# Patient Record
Sex: Female | Born: 1995 | Race: Black or African American | Hispanic: No | Marital: Single | Smoking: Former smoker
Health system: Southern US, Community
[De-identification: ages and names within clinical notes are randomized; demographics above are authoritative.]

## PROBLEM LIST (undated history)

## (undated) DIAGNOSIS — F419 Anxiety disorder, unspecified: Secondary | ICD-10-CM

## (undated) DIAGNOSIS — Z789 Other specified health status: Secondary | ICD-10-CM

## (undated) DIAGNOSIS — D649 Anemia, unspecified: Secondary | ICD-10-CM

## (undated) DIAGNOSIS — R519 Headache, unspecified: Secondary | ICD-10-CM

## (undated) DIAGNOSIS — R87629 Unspecified abnormal cytological findings in specimens from vagina: Secondary | ICD-10-CM

## (undated) DIAGNOSIS — N200 Calculus of kidney: Secondary | ICD-10-CM

## (undated) DIAGNOSIS — F32A Depression, unspecified: Secondary | ICD-10-CM

## (undated) HISTORY — DX: Anemia, unspecified: D64.9

---

## 2016-09-22 DIAGNOSIS — O1490 Unspecified pre-eclampsia, unspecified trimester: Secondary | ICD-10-CM

## 2016-09-22 HISTORY — DX: Unspecified pre-eclampsia, unspecified trimester: O14.90

## 2020-07-01 ENCOUNTER — Other Ambulatory Visit: Payer: Self-pay

## 2020-07-01 ENCOUNTER — Ambulatory Visit (HOSPITAL_COMMUNITY)
Admission: EM | Admit: 2020-07-01 | Discharge: 2020-07-01 | Disposition: A | Discharge status: Home / Self Care | Payer: Medicaid Other | Attending: Family Medicine | Admitting: Family Medicine

## 2020-07-01 ENCOUNTER — Encounter (HOSPITAL_COMMUNITY): Payer: Self-pay | Admitting: *Deleted

## 2020-07-01 DIAGNOSIS — J020 Streptococcal pharyngitis: Secondary | ICD-10-CM | POA: Diagnosis present

## 2020-07-01 DIAGNOSIS — Z20822 Contact with and (suspected) exposure to covid-19: Secondary | ICD-10-CM | POA: Diagnosis not present

## 2020-07-01 LAB — SARS CORONAVIRUS 2 (TAT 6-24 HRS): SARS Coronavirus 2: NEGATIVE

## 2020-07-01 MED ORDER — AZITHROMYCIN 250 MG PO TABS: ORAL_TABLET | ORAL | 0 refills | Status: DC

## 2020-07-01 MED ORDER — LIDOCAINE VISCOUS HCL 2 % MT SOLN: 10.0000 mL | OROMUCOSAL | 0 refills | Status: DC | PRN

## 2020-07-01 NOTE — ED Triage Notes (Signed)
Pt reports a sore throat and Rt ear pain that started this AM. Pt also reports chills after getting off work today. Pt has also been taking care of her 2 sick family members.

## 2020-07-04 NOTE — ED Provider Notes (Signed)
[facility]    CSN: [id number] Arrival date & time: 07/01/20  1712      History   Chief Complaint Chief Complaint  Patient presents with  . Otalgia    RT  . Sore Throat    HPI Sarah Copeland is a 24 y.o. female.   Here today with new onset severe sore throat radiating up to right ear since this morning. Has also had fever and fatigue. Denies cough, congestion, CP, SOB, HAs, N/V/D. Taking OTC pain relievers with minimal relief. Several sick contacts in the home right now.       History reviewed. No pertinent past medical history.  There are no problems to display for this patient.   History reviewed. No pertinent surgical history.  OB History   No obstetric history on file.      Home Medications    Prior to Admission medications   Medication Sig Start Date End Date Taking? Authorizing Provider  azithromycin (ZITHROMAX) 250 MG tablet Take 2 tabs day one, then 1 tab daily until complete 07/01/20   Particia Nearing, PA-C  lidocaine (XYLOCAINE) 2 % solution Use as directed 10 mLs in the mouth or throat as needed for mouth pain. 07/01/20   Particia Nearing, PA-C    Family History History reviewed. No pertinent family history.  Social History Social History   Tobacco Use  . Smoking status: Never Smoker  . Smokeless tobacco: Never Used  Substance Use Topics  . Alcohol use: Never  . Drug use: Never     Allergies   Amoxicillin   Review of Systems Review of Systems PER HPI    Physical Exam Triage Vital Signs ED Triage Vitals  Enc Vitals Group     BP 07/01/20 1754 131/83     Pulse Rate 07/01/20 1754 98     Resp 07/01/20 1754 18     Temp 07/01/20 1754 100 F (37.8 C)     Temp Source 07/01/20 1754 Oral     SpO2 07/01/20 1754 99 %     Weight 07/01/20 1757 203 lb (92.1 kg)     Height 07/01/20 1757 5\' 7"  (1.702 m)     Head Circumference --      Peak Flow --      Pain Score 07/01/20 1756 10     Pain Loc --      Pain Edu?  --      Excl. in GC? --    No data found.  Updated Vital Signs BP 131/83 (BP Location: Left Arm)   Pulse 98   Temp 100 F (37.8 C) (Oral)   Resp 18   Ht 5\' 7"  (1.702 m)   Wt 203 lb (92.1 kg)   LMP 07/01/2020   SpO2 99%   BMI 31.79 kg/m   Visual Acuity Right Eye Distance:   Left Eye Distance:   Bilateral Distance:    Right Eye Near:   Left Eye Near:    Bilateral Near:     Physical Exam Vitals and nursing note reviewed.  Constitutional:      Appearance: Normal appearance. She is not ill-appearing.  HENT:     Head: Atraumatic.     Right Ear: Tympanic membrane normal.     Left Ear: Tympanic membrane normal.     Nose: Nose normal.     Mouth/Throat:     Mouth: Mucous membranes are moist.     Pharynx: Posterior oropharyngeal erythema (significant tonsillar edema, erythema and copious exudates)  present.  Eyes:     Extraocular Movements: Extraocular movements intact.     Conjunctiva/sclera: Conjunctivae normal.  Cardiovascular:     Rate and Rhythm: Normal rate and regular rhythm.     Heart sounds: Normal heart sounds.  Pulmonary:     Effort: Pulmonary effort is normal.     Breath sounds: Normal breath sounds. No wheezing or rales.  Abdominal:     General: Bowel sounds are normal. There is no distension.     Palpations: Abdomen is soft.     Tenderness: There is no abdominal tenderness. There is no guarding.  Musculoskeletal:        General: Normal range of motion.     Cervical back: Normal range of motion and neck supple.  Lymphadenopathy:     Cervical: Cervical adenopathy (b/l) present.  Skin:    General: Skin is warm and dry.  Neurological:     Mental Status: She is alert and oriented to person, place, and time.  Psychiatric:        Mood and Affect: Mood normal.        Thought Content: Thought content normal.        Judgment: Judgment normal.      UC Treatments / Results  Labs (all labs ordered are listed, but only abnormal results are displayed) Labs  Reviewed  SARS CORONAVIRUS 2 (TAT 6-24 HRS)    EKG   Radiology No results found.  Procedures Procedures (including critical care time)  Medications Ordered in UC Medications - No data to display  Initial Impression / Assessment and Plan / UC Course  I have reviewed the triage vital signs and the nursing notes.  Pertinent labs & imaging results that were available during my care of the patient were reviewed by me and considered in my medical decision making (see chart for details).     COVID test pending, strong suspicion for bacterial pharyngitis due to exam, fever and history. Will tx with zpak, viscous lidocaine, salt water gargles, and OTC pain relievers. Return if sxs worsening or failing to improve.   Final Clinical Impressions(s) / UC Diagnoses   Final diagnoses:  Pharyngitis due to Streptococcus species   Discharge Instructions   None    ED Prescriptions    Medication Sig Dispense Auth. Provider   azithromycin (ZITHROMAX) 250 MG tablet Take 2 tabs day one, then 1 tab daily until complete 6 tablet Particia Nearing, PA-C   lidocaine (XYLOCAINE) 2 % solution Use as directed 10 mLs in the mouth or throat as needed for mouth pain. 100 mL Particia Nearing, [place]     PDMP not reviewed this encounter.   Particia Nearing, [place] 07/04/20 2043

## 2020-09-22 ENCOUNTER — Encounter (HOSPITAL_COMMUNITY): Payer: Self-pay

## 2020-09-22 ENCOUNTER — Emergency Department (HOSPITAL_COMMUNITY)
Admission: EM | Admit: 2020-09-22 | Discharge: 2020-09-23 | Disposition: A | Discharge status: Home / Self Care | Payer: Medicaid Other | Attending: Emergency Medicine | Admitting: Emergency Medicine

## 2020-09-22 DIAGNOSIS — Z20822 Contact with and (suspected) exposure to covid-19: Secondary | ICD-10-CM | POA: Diagnosis not present

## 2020-09-22 DIAGNOSIS — R070 Pain in throat: Secondary | ICD-10-CM | POA: Diagnosis not present

## 2020-09-22 DIAGNOSIS — R519 Headache, unspecified: Secondary | ICD-10-CM | POA: Insufficient documentation

## 2020-09-22 DIAGNOSIS — Z5321 Procedure and treatment not carried out due to patient leaving prior to being seen by health care provider: Secondary | ICD-10-CM | POA: Diagnosis not present

## 2020-09-22 LAB — RESP PANEL BY RT-PCR (FLU A&B, COVID) ARPGX2
Influenza A by PCR: NEGATIVE
Influenza B by PCR: NEGATIVE
SARS Coronavirus 2 by RT PCR: NEGATIVE

## 2020-09-22 NOTE — ED Triage Notes (Signed)
Pt exposed to covid and wants testing, vaccinated, not boosted. Headache, sore throat

## 2020-09-23 NOTE — ED Notes (Signed)
Called for roo x2 with no response and not visible in lobby

## 2020-10-18 ENCOUNTER — Ambulatory Visit (HOSPITAL_COMMUNITY)
Admission: EM | Admit: 2020-10-18 | Discharge: 2020-10-18 | Disposition: A | Discharge status: Home / Self Care | Payer: Medicaid Other | Attending: Family Medicine | Admitting: Family Medicine

## 2020-10-18 ENCOUNTER — Encounter (HOSPITAL_COMMUNITY): Payer: Self-pay

## 2020-10-18 ENCOUNTER — Other Ambulatory Visit: Payer: Self-pay

## 2020-10-18 DIAGNOSIS — Z20822 Contact with and (suspected) exposure to covid-19: Secondary | ICD-10-CM | POA: Insufficient documentation

## 2020-10-18 DIAGNOSIS — N390 Urinary tract infection, site not specified: Secondary | ICD-10-CM | POA: Diagnosis present

## 2020-10-18 DIAGNOSIS — J069 Acute upper respiratory infection, unspecified: Secondary | ICD-10-CM | POA: Diagnosis present

## 2020-10-18 LAB — POCT URINALYSIS DIPSTICK, ED / UC
Bilirubin Urine: NEGATIVE
Glucose, UA: NEGATIVE mg/dL
Hgb urine dipstick: NEGATIVE
Nitrite: POSITIVE — AB
Protein, ur: NEGATIVE mg/dL
Specific Gravity, Urine: 1.025 (ref 1.005–1.030)
Urobilinogen, UA: 1 mg/dL (ref 0.0–1.0)
pH: 6 (ref 5.0–8.0)

## 2020-10-18 MED ORDER — NITROFURANTOIN MONOHYD MACRO 100 MG PO CAPS: 100.0000 mg | ORAL_CAPSULE | Freq: Two times a day (BID) | ORAL | 0 refills | Status: DC

## 2020-10-18 NOTE — ED Triage Notes (Signed)
Pt in with c/o body aches that started last night, also c/o fatigue and ST and right ear pain.  Also c/o headache that has been going on for over 1 week  Pt has been taking tylenol with no relief

## 2020-10-18 NOTE — ED Provider Notes (Signed)
[facility]    CSN: [id number] Arrival date & time: 10/18/20  1743      History   Chief Complaint Chief Complaint  Patient presents with  . Generalized Body Aches  . Sore Throat  . Headache    HPI Sarah Copeland is a 25 y.o. female.   Several day history of sore throat, headache, congestion, mild ear pressure and now 1-2 days of nocturia. Denies fever, chills, flank pain, abdominal pain, N/V/D, CP, SOB, dysuria, hematuria, vaginal sxs. Not taking anything OTC for sxs. No known sick contacts. No known chronic medical problems.      History reviewed. No pertinent past medical history.  There are no problems to display for this patient.   History reviewed. No pertinent surgical history.  OB History   No obstetric history on file.      Home Medications    Prior to Admission medications   Medication Sig Start Date End Date Taking? Authorizing Provider  nitrofurantoin, macrocrystal-monohydrate, (MACROBID) 100 MG capsule Take 1 capsule (100 mg total) by mouth 2 (two) times daily. 10/18/20  Yes Particia Nearing, PA-C  azithromycin [facility]) 250 MG tablet Take 2 tabs day one, then 1 tab daily until complete 07/01/20   Particia Nearing, PA-C  lidocaine (XYLOCAINE) 2 % solution Use as directed 10 mLs in the mouth or throat as needed for mouth pain. 07/01/20   Particia Nearing, PA-C    Family History History reviewed. No pertinent family history.  Social History Social History   Tobacco Use  . Smoking status: Never Smoker  . Smokeless tobacco: Never Used  Substance Use Topics  . Alcohol use: Never  . Drug use: Never     Allergies   Amoxicillin   Review of Systems Review of Systems PER HPI    Physical Exam Triage Vital Signs ED Triage Vitals  Enc Vitals Group     BP 10/18/20 1757 133/76     Pulse Rate 10/18/20 1757 83     Resp 10/18/20 1757 19     Temp 10/18/20 1757 98.9 F (37.2 C)     Temp Source 10/18/20 1757 Oral      SpO2 10/18/20 1757 100 %     Weight --      Height --      Head Circumference --      Peak Flow --      Pain Score 10/18/20 1753 8     Pain Loc --      Pain Edu? --      Excl. in GC? --    No data found.  Updated Vital Signs BP 133/76 (BP Location: Right Arm)   Pulse 83   Temp 98.9 F (37.2 C) (Oral)   Resp 19   LMP 09/24/2020 (Approximate)   SpO2 100%   Visual Acuity Right Eye Distance:   Left Eye Distance:   Bilateral Distance:    Right Eye Near:   Left Eye Near:    Bilateral Near:     Physical Exam Vitals and nursing note reviewed.  Constitutional:      Appearance: Normal appearance. She is not ill-appearing.  HENT:     Head: Atraumatic.     Right Ear: Tympanic membrane normal.     Left Ear: Tympanic membrane normal.     Nose: Rhinorrhea present.     Mouth/Throat:     Mouth: Mucous membranes are moist.     Pharynx: Oropharynx is clear. Posterior oropharyngeal erythema present.  Eyes:     Extraocular Movements: Extraocular movements intact.     Conjunctiva/sclera: Conjunctivae normal.  Cardiovascular:     Rate and Rhythm: Normal rate and regular rhythm.     Heart sounds: Normal heart sounds.  Pulmonary:     Effort: Pulmonary effort is normal.     Breath sounds: Normal breath sounds.  Abdominal:     General: Bowel sounds are normal. There is no distension.     Palpations: Abdomen is soft.     Tenderness: There is no abdominal tenderness. There is no right CVA tenderness, left CVA tenderness or guarding.  Musculoskeletal:        General: Normal range of motion.     Cervical back: Normal range of motion and neck supple.  Skin:    General: Skin is warm and dry.  Neurological:     Mental Status: She is alert and oriented to person, place, and time.  Psychiatric:        Mood and Affect: Mood normal.        Thought Content: Thought content normal.        Judgment: Judgment normal.      UC Treatments / Results  Labs (all labs ordered are listed,  but only abnormal results are displayed) Labs Reviewed  POCT URINALYSIS DIPSTICK, ED / UC - Abnormal; Notable for the following components:      Result Value   Ketones, ur TRACE (*)    Nitrite POSITIVE (*)    Leukocytes,Ua SMALL (*)    All other components within normal limits  SARS CORONAVIRUS 2 (TAT 6-24 HRS)  URINE CULTURE    EKG   Radiology No results found.  Procedures Procedures (including critical care time)  Medications Ordered in UC Medications - No data to display  Initial Impression / Assessment and Plan / UC Course  I have reviewed the triage vital signs and the nursing notes.  Pertinent labs & imaging results that were available during my care of the patient were reviewed by me and considered in my medical decision making (see chart for details).     Treat UTI with macrobid, fluids and await urine culture. COVID pcr pending for URI sxs, discussed OTC remedies, supportive care, isolation. Return for acutely worsening sxs.   Final Clinical Impressions(s) / UC Diagnoses   Final diagnoses:  Acute lower UTI  Viral URI   Discharge Instructions   None    ED Prescriptions    Medication Sig Dispense Auth. Provider   nitrofurantoin, macrocrystal-monohydrate, (MACROBID) 100 MG capsule Take 1 capsule (100 mg total) by mouth 2 (two) times daily. 10 capsule Particia Nearing, [place]     PDMP not reviewed this encounter.   Particia Nearing, [place] 10/18/20 1911

## 2020-10-19 LAB — SARS CORONAVIRUS 2 (TAT 6-24 HRS): SARS Coronavirus 2: NEGATIVE

## 2020-10-21 LAB — URINE CULTURE: Culture: >=100000 — AB

## 2020-10-30 ENCOUNTER — Ambulatory Visit (HOSPITAL_COMMUNITY)
Admission: EM | Admit: 2020-10-30 | Discharge: 2020-10-30 | Disposition: A | Discharge status: Home / Self Care | Payer: Medicaid Other | Attending: Family Medicine | Admitting: Family Medicine

## 2020-10-30 ENCOUNTER — Other Ambulatory Visit: Payer: Self-pay

## 2020-10-30 ENCOUNTER — Ambulatory Visit (INDEPENDENT_AMBULATORY_CARE_PROVIDER_SITE_OTHER): Payer: Medicaid Other

## 2020-10-30 ENCOUNTER — Ambulatory Visit (HOSPITAL_COMMUNITY): Payer: Medicaid Other

## 2020-10-30 ENCOUNTER — Encounter (HOSPITAL_COMMUNITY): Payer: Self-pay | Admitting: Emergency Medicine

## 2020-10-30 DIAGNOSIS — S93491A Sprain of other ligament of right ankle, initial encounter: Secondary | ICD-10-CM

## 2020-10-30 DIAGNOSIS — M79671 Pain in right foot: Secondary | ICD-10-CM

## 2020-10-30 DIAGNOSIS — M25571 Pain in right ankle and joints of right foot: Secondary | ICD-10-CM

## 2020-10-30 DIAGNOSIS — R8290 Unspecified abnormal findings in urine: Secondary | ICD-10-CM

## 2020-10-30 LAB — POCT URINALYSIS DIPSTICK, ED / UC
Bilirubin Urine: NEGATIVE
Glucose, UA: NEGATIVE mg/dL
Hgb urine dipstick: NEGATIVE
Ketones, ur: NEGATIVE mg/dL
Leukocytes,Ua: NEGATIVE
Nitrite: NEGATIVE
Protein, ur: NEGATIVE mg/dL
Specific Gravity, Urine: 1.015 (ref 1.005–1.030)
Urobilinogen, UA: 0.2 mg/dL (ref 0.0–1.0)
pH: 5.5 (ref 5.0–8.0)

## 2020-10-30 NOTE — Discharge Instructions (Addendum)
If not allergic, you may use over the counter ibuprofen or acetaminophen as needed. ° °

## 2020-10-30 NOTE — ED Triage Notes (Signed)
Pt presents with right foot pain after falling down steps earlier today. States felt like she rolled ankle. Pain is worst on top of foot.   Pt also c/o urinary frequency and urine odor. States completed antibiotics that were prescribed on 1/27. States doesn't feel worse, but does not feel better.

## 2020-10-31 NOTE — ED Provider Notes (Signed)
[facility]   [id number] 10/30/20 Arrival Time: 1835  ASSESSMENT & PLAN:  1. Pain in joint involving right ankle and foot   2. Sprain of anterior talofibular ligament of right ankle, initial encounter   3. Cloudy urine     I have personally viewed the imaging studies ordered this visit. No fractures appreciated.  ASO. WBAT. Has ibuprofen to take. U/A normal; reassured.  Recommend:  Follow-up Information    [facility].   Why: If worsening or failing to improve as anticipated. Contact information: [address] [place] [address] [phone]       Schedule an appointment as soon as possible for a visit  with [facility].   Contact information: [address] [place] [address] [phone]              Reviewed expectations re: course of current medical issues. Questions answered. Outlined signs and symptoms indicating need for more acute intervention. Patient verbalized understanding. After Visit Summary given.  SUBJECTIVE: History from: patient. Sarah Copeland is a 25 y.o. female who reports fairly persistent moderate pain of her R ankle/foot; described as aching; without radiation. Onset: abrupt. First noted: today. Injury/trama: reports falling/twisting ankle/foot on stairs today. Symptoms have progressed to a point and plateaued since beginning. Aggravating factors: certain movements and weight bearing. Alleviating factors: rest. Associated symptoms: none reported. Extremity sensation changes or weakness: none. Self treatment: has not tried OTC therapies.  History of similar: no.  History reviewed. No pertinent surgical history.   Also recently completed antibiotic for UTI. Still with "cloudy urine". Afebrile. No dysuria or vag d/c.   OBJECTIVE:  Vitals:   10/30/20 1926  BP: 127/77  Pulse: 89  Resp: 18  Temp: 98.9 F (37.2 C)  TempSrc: Oral   SpO2: 100%    General appearance: alert; no distress HEENT: [place]; AT Neck: supple with FROM Resp: unlabored respirations Extremities: . RLE: warm with well perfused appearance; fairly well localized moderate tenderness over right lateral ankle at ATFL; without gross deformities; swelling: moderate; bruising: none; ankle ROM: normal, with discomfort CV: brisk extremity capillary refill of RLE; 2+ DP pulse of RLE. Skin: warm and dry; no visible rashes Neurologic: gait normal but favors R ankle; normal sensation and strength of RLE Psychological: alert and cooperative; normal mood and affect  Imaging: DG Ankle Complete Right  Result Date: 10/30/2020 CLINICAL DATA:  Foot and ankle pain after fall EXAM: RIGHT ANKLE - COMPLETE 3+ VIEW COMPARISON:  Foot series today FINDINGS: There is no evidence of fracture, dislocation, or joint effusion. There is no evidence of arthropathy or other focal bone abnormality. Soft tissues are unremarkable. IMPRESSION: Negative. Electronically Signed   By: Charlett Nose M.D.   On: 10/30/2020 20:12   DG Foot Complete Right  Result Date: 10/30/2020 CLINICAL DATA:  Trauma EXAM: RIGHT FOOT COMPLETE - 3+ VIEW COMPARISON:  None. FINDINGS: There is no evidence of fracture or dislocation. There is no evidence of arthropathy or other focal bone abnormality. Soft tissues are unremarkable. IMPRESSION: Negative. Electronically Signed   By: Charlett Nose M.D.   On: 10/30/2020 20:11      Allergies  Allergen Reactions  . Amoxicillin Anaphylaxis    History reviewed. No pertinent past medical history.    Social History   Socioeconomic History  . Marital status: Single    Spouse name: Not on file  . Number of children: Not on file  .  Years of education: Not on file  . Highest education level: Not on file  Occupational History  . Not on file  Tobacco Use  . Smoking status: Never Smoker  . Smokeless tobacco: Never Used  Substance and Sexual Activity  . Alcohol use:  Never  . Drug use: Never  . Sexual activity: Not Currently  Other Topics Concern  . Not on file  Social History Narrative  . Not on file   Social Determinants of Health   Financial Resource Strain: Not on file  Food Insecurity: Not on file  Transportation Needs: Not on file  Physical Activity: Not on file  Stress: Not on file  Social Connections: Not on file   History reviewed. No pertinent family history. History reviewed. No pertinent surgical history.    Mardella Layman, MD 10/31/20 [phone]

## 2020-11-22 ENCOUNTER — Encounter (HOSPITAL_COMMUNITY): Payer: Self-pay | Admitting: Emergency Medicine

## 2020-11-22 ENCOUNTER — Other Ambulatory Visit: Payer: Self-pay

## 2020-11-22 ENCOUNTER — Ambulatory Visit (HOSPITAL_COMMUNITY)
Admission: EM | Admit: 2020-11-22 | Discharge: 2020-11-22 | Disposition: A | Discharge status: Home / Self Care | Payer: Medicaid Other | Attending: Family Medicine | Admitting: Family Medicine

## 2020-11-22 DIAGNOSIS — N3000 Acute cystitis without hematuria: Secondary | ICD-10-CM

## 2020-11-22 LAB — POCT URINALYSIS DIPSTICK, ED / UC
Bilirubin Urine: NEGATIVE
Glucose, UA: NEGATIVE mg/dL
Hgb urine dipstick: NEGATIVE
Nitrite: POSITIVE — AB
Protein, ur: NEGATIVE mg/dL
Specific Gravity, Urine: >=1.03 (ref 1.005–1.030)
Urobilinogen, UA: 0.2 mg/dL (ref 0.0–1.0)
pH: 5.5 (ref 5.0–8.0)

## 2020-11-22 LAB — POC URINE PREG, ED: Preg Test, Ur: POSITIVE — AB

## 2020-11-22 MED ORDER — NITROFURANTOIN MONOHYD MACRO 100 MG PO CAPS: 100.0000 mg | ORAL_CAPSULE | Freq: Two times a day (BID) | ORAL | 0 refills | Status: DC

## 2020-11-22 MED ORDER — MICONAZOLE NITRATE 2 % EX CREA: 1.0000 "application " | TOPICAL_CREAM | Freq: Two times a day (BID) | CUTANEOUS | 0 refills | Status: DC

## 2020-11-22 NOTE — Discharge Instructions (Addendum)
Take antibiotic once in the morning and once in the evening for 5 days  Can use cream on the outside twice a day for up to 14 days  Can use lubrication for additional comfort   Follow up for persistent symptoms, blood in urine, abdominal pain, back pain, urgency, discharge, increase odor, new lesions

## 2020-11-22 NOTE — ED Triage Notes (Signed)
Pt states that she has vaginal discharge and dysuria that started Saturday. Pt states that she took a pregnancy test Saturday tested positive.

## 2020-11-22 NOTE — ED Provider Notes (Signed)
[facility]    CSN: [id number] Arrival date & time: 11/22/20  1033      History   Chief Complaint Chief Complaint  Patient presents with  . Vaginal Discharge  . Dysuria    HPI Sarah Copeland is a 25 y.o. female.   Patient presents with vaginal irritation, vaginal dryness, Sarah Copeland thick discharge and urinary frequency starting one day ago worsening this morning. Denies itching, odor, urgency, abdominal pain, flank pain, fever, chills. Denies known STI contact. Pregnancy test on 3/2, positive.   History reviewed. No pertinent past medical history.  There are no problems to display for this patient.   History reviewed. No pertinent surgical history.  OB History   No obstetric history on file.      Home Medications    Prior to Admission medications   Medication Sig Start Date End Date Taking? Authorizing Provider  miconazole (MICOTIN) 2 % cream Apply 1 application topically 2 (two) times daily. 11/22/20  Yes Mari Battaglia, Adrienne R, NP  nitrofurantoin, macrocrystal-monohydrate, (MACROBID) 100 MG capsule Take 1 capsule (100 mg total) by mouth 2 (two) times daily. 11/22/20  Yes Noell Lorensen, Adrienne R, NP  lidocaine (XYLOCAINE) 2 % solution Use as directed 10 mLs in the mouth or throat as needed for mouth pain. 07/01/20   Particia Nearing, PA-C    Family History History reviewed. No pertinent family history.  Social History Social History   Tobacco Use  . Smoking status: Never Smoker  . Smokeless tobacco: Never Used  Substance Use Topics  . Alcohol use: Never  . Drug use: Never     Allergies   Amoxicillin   Review of Systems Review of Systems  Constitutional: Negative.   Respiratory: Negative.   Cardiovascular: Negative.   Gastrointestinal: Negative.   Genitourinary: Positive for frequency and vaginal discharge. Negative for decreased urine volume, difficulty urinating, dyspareunia, dysuria, enuresis, flank pain, genital sores, hematuria, menstrual  problem, pelvic pain, urgency, vaginal bleeding and vaginal pain.  Musculoskeletal: Negative.   Skin: Negative.      Physical Exam Triage Vital Signs ED Triage Vitals  Enc Vitals Group     BP 11/22/20 1103 130/74     Pulse Rate 11/22/20 1103 (!) 104     Resp 11/22/20 1103 18     Temp --      Temp Source 11/22/20 1103 Oral     SpO2 11/22/20 1103 95 %     Weight --      Height --      Head Circumference --      Peak Flow --      Pain Score 11/22/20 1101 0     Pain Loc --      Pain Edu? --      Excl. in GC? --    No data found.  Updated Vital Signs BP 130/74 (BP Location: Right Arm)   Pulse (!) 104   Resp 18   LMP 10/20/2020   SpO2 95%   Visual Acuity Right Eye Distance:   Left Eye Distance:   Bilateral Distance:    Right Eye Near:   Left Eye Near:    Bilateral Near:     Physical Exam Constitutional:      Appearance: Normal appearance. She is normal weight.  HENT:     Head: Normocephalic.  Eyes:     Extraocular Movements: Extraocular movements intact.  Pulmonary:     Effort: Pulmonary effort is normal.  Abdominal:     General: Abdomen is  flat. Bowel sounds are normal. There is no distension.     Palpations: Abdomen is soft. There is no mass.     Tenderness: There is no abdominal tenderness. There is no right CVA tenderness, left CVA tenderness or guarding.  Musculoskeletal:     Cervical back: Normal range of motion and neck supple.  Skin:    General: Skin is warm and dry.  Neurological:     Mental Status: She is alert and oriented to person, place, and time. Mental status is at baseline.  Psychiatric:        Mood and Affect: Mood normal.        Behavior: Behavior normal.        Thought Content: Thought content normal.        Judgment: Judgment normal.      UC Treatments / Results  Labs (all labs ordered are listed, but only abnormal results are displayed) Labs Reviewed  POCT URINALYSIS DIPSTICK, ED / UC - Abnormal; Notable for the following  components:      Result Value   Ketones, ur TRACE (*)    Nitrite POSITIVE (*)    Leukocytes,Ua TRACE (*)    All other components within normal limits  POC URINE PREG, ED - Abnormal; Notable for the following components:   Preg Test, Ur POSITIVE (*)    All other components within normal limits    EKG   Radiology No results found.  Procedures Procedures (including critical care time)  Medications Ordered in UC Medications - No data to display  Initial Impression / Assessment and Plan / UC Course  I have reviewed the triage vital signs and the nursing notes.  Pertinent labs & imaging results that were available during my care of the patient were reviewed by me and considered in my medical decision making (see chart for details).  Clinical Course as of 11/22/20 1145  Thu Nov 22, 2020  1134 Leukocytes,Ua(!): TRACE [AW]    Clinical Course User Index [AW] Valinda Hoar, NP    Acute cystitis  1. Urinalysis- positive, Macrobid 100 mg bid for 5 days 2. Urine pregnancy- positive- given information to the [facility] to establish care 3. Miconazole 2% cream bid as needed for 14 days, symptomatic of a yeast infection  4. Follow up precautions given. Verbalized understanding  Final Clinical Impressions(s) / UC Diagnoses   Final diagnoses:  Acute cystitis without hematuria     Discharge Instructions     Take antibiotic once in the morning and once in the evening for 5 days  Can use cream on the outside twice a day for up to 14 days  Can use lubrication for additional comfort   Follow up for persistent symptoms, blood in urine, abdominal pain, back pain, urgency, discharge, increase odor, new lesions    ED Prescriptions    Medication Sig Dispense Auth. Provider   nitrofurantoin, macrocrystal-monohydrate, (MACROBID) 100 MG capsule Take 1 capsule (100 mg total) by mouth 2 (two) times daily. 10 capsule Dontario Evetts, Adrienne R, NP   miconazole (MICOTIN) 2 % cream Apply 1  application topically 2 (two) times daily. 28.35 g Valinda Hoar, NP     PDMP not reviewed this encounter.   Valinda Hoar, NP 11/22/20 1334

## 2020-11-29 ENCOUNTER — Other Ambulatory Visit: Payer: Self-pay

## 2020-11-29 ENCOUNTER — Inpatient Hospital Stay (HOSPITAL_COMMUNITY)
Admission: AD | Admit: 2020-11-29 | Discharge: 2020-11-30 | Disposition: A | Discharge status: Home / Self Care | Payer: Medicaid Other | Attending: Obstetrics and Gynecology | Admitting: Obstetrics and Gynecology

## 2020-11-29 ENCOUNTER — Inpatient Hospital Stay (HOSPITAL_COMMUNITY): Payer: Medicaid Other

## 2020-11-29 ENCOUNTER — Encounter (HOSPITAL_COMMUNITY): Payer: Self-pay

## 2020-11-29 DIAGNOSIS — O99891 Other specified diseases and conditions complicating pregnancy: Secondary | ICD-10-CM | POA: Diagnosis not present

## 2020-11-29 DIAGNOSIS — Z880 Allergy status to penicillin: Secondary | ICD-10-CM | POA: Insufficient documentation

## 2020-11-29 DIAGNOSIS — B9689 Other specified bacterial agents as the cause of diseases classified elsewhere: Secondary | ICD-10-CM | POA: Diagnosis not present

## 2020-11-29 DIAGNOSIS — O23591 Infection of other part of genital tract in pregnancy, first trimester: Secondary | ICD-10-CM | POA: Insufficient documentation

## 2020-11-29 DIAGNOSIS — O26851 Spotting complicating pregnancy, first trimester: Secondary | ICD-10-CM

## 2020-11-29 DIAGNOSIS — Z3A01 Less than 8 weeks gestation of pregnancy: Secondary | ICD-10-CM | POA: Diagnosis not present

## 2020-11-29 DIAGNOSIS — O209 Hemorrhage in early pregnancy, unspecified: Secondary | ICD-10-CM | POA: Diagnosis present

## 2020-11-29 DIAGNOSIS — N760 Acute vaginitis: Secondary | ICD-10-CM | POA: Diagnosis not present

## 2020-11-29 DIAGNOSIS — Z3490 Encounter for supervision of normal pregnancy, unspecified, unspecified trimester: Secondary | ICD-10-CM

## 2020-11-29 HISTORY — DX: Other specified health status: Z78.9

## 2020-11-29 NOTE — ED Triage Notes (Signed)
Reports positive for pregnancy last week and today having abdominal cramping and light vaginal bleeding.

## 2020-11-29 NOTE — MAU Note (Signed)
Pt reports some cramping, some lower back pain, some nausea. Had "one drop of blood" on her panties this afternoon.

## 2020-11-29 NOTE — ED Provider Notes (Signed)
Emergency Medicine Provider OB Triage Evaluation Note  Sarah Copeland is a 25 y.o. female, G3P2, at Unknown gestation who presents to the emergency department with complaints of abdominal cramping and vaginal spotting that began today.  Reports she is received some early OB care with the health department however has not had a formal ultrasound.  Is awaiting appointment with [facility] health [facility].  Review of  Systems  Positive: Abdominal pain Negative: Fever  Physical Exam  BP 126/80 (BP Location: Left Arm)   Pulse (!) 104   Temp 99 F (37.2 C) (Oral)   Resp 16   Ht 5\' 7"  (1.702 m)   Wt 92 kg   LMP  (LMP Unknown)   SpO2 99%   BMI 31.77 kg/m  General: Awake, no distress  HEENT: Atraumatic  Resp: Normal effort  Cardiac: Normal rate Abd: Nondistended, nontender  MSK: Moves all extremities without difficulty Neuro: Speech clear  Medical Decision Making  Pt evaluated for pregnancy concern and is stable for transfer to MAU. Pt is in agreement with plan for transfer.  9:57 PM Discussed with MAU APP, Sam, who accepts patient in transfer.  Clinical Impression  No diagnosis found.     Nicholis Stepanek, N, PA-C 11/30/20 0011    01/30/21, MD 12/03/20 [phone]

## 2020-11-29 NOTE — ED Notes (Signed)
Patient taken via wheelchair by tech to MAU

## 2020-11-30 DIAGNOSIS — B9689 Other specified bacterial agents as the cause of diseases classified elsewhere: Secondary | ICD-10-CM | POA: Diagnosis not present

## 2020-11-30 DIAGNOSIS — O26851 Spotting complicating pregnancy, first trimester: Secondary | ICD-10-CM | POA: Diagnosis not present

## 2020-11-30 DIAGNOSIS — N760 Acute vaginitis: Secondary | ICD-10-CM

## 2020-11-30 DIAGNOSIS — Z3A01 Less than 8 weeks gestation of pregnancy: Secondary | ICD-10-CM

## 2020-11-30 DIAGNOSIS — O99891 Other specified diseases and conditions complicating pregnancy: Secondary | ICD-10-CM

## 2020-11-30 LAB — CBC
HCT: 31.8 % — ABNORMAL LOW (ref 36.0–46.0)
Hemoglobin: 10.4 g/dL — ABNORMAL LOW (ref 12.0–15.0)
MCH: 21.6 pg — ABNORMAL LOW (ref 26.0–34.0)
MCHC: 32.7 g/dL (ref 30.0–36.0)
MCV: 66 fL — ABNORMAL LOW (ref 80.0–100.0)
Platelets: 304 10*3/uL (ref 150–400)
RBC: 4.82 MIL/uL (ref 3.87–5.11)
RDW: 15.2 % (ref 11.5–15.5)
WBC: 9.7 10*3/uL (ref 4.0–10.5)
nRBC: 0 % (ref 0.0–0.2)

## 2020-11-30 LAB — COMPREHENSIVE METABOLIC PANEL
ALT: 11 U/L (ref 0–44)
AST: 14 U/L — ABNORMAL LOW (ref 15–41)
Albumin: 3.5 g/dL (ref 3.5–5.0)
Alkaline Phosphatase: 75 U/L (ref 38–126)
Anion gap: 5 (ref 5–15)
BUN: 9 mg/dL (ref 6–20)
CO2: 26 mmol/L (ref 22–32)
Calcium: 9.3 mg/dL (ref 8.9–10.3)
Chloride: 103 mmol/L (ref 98–111)
Creatinine, Ser: 0.69 mg/dL (ref 0.44–1.00)
GFR, Estimated: >60 mL/min (ref >60)
Glucose, Bld: 84 mg/dL (ref 70–99)
Potassium: 3.6 mmol/L (ref 3.5–5.1)
Sodium: 134 mmol/L — ABNORMAL LOW (ref 135–145)
Total Bilirubin: 0.2 mg/dL — ABNORMAL LOW (ref 0.3–1.2)
Total Protein: 7.6 g/dL (ref 6.5–8.1)

## 2020-11-30 LAB — GC/CHLAMYDIA PROBE AMP (~~LOC~~) NOT AT ARMC
Chlamydia: NEGATIVE
Comment: NEGATIVE
Comment: NORMAL
Neisseria Gonorrhea: NEGATIVE

## 2020-11-30 LAB — WET PREP, GENITAL
Sperm: NONE SEEN
Trich, Wet Prep: NONE SEEN
Yeast Wet Prep HPF POC: NONE SEEN

## 2020-11-30 LAB — HCG, QUANTITATIVE, PREGNANCY: hCG, Beta Chain, Quant, S: 18783 m[IU]/mL — ABNORMAL HIGH (ref <5)

## 2020-11-30 LAB — ABO/RH: ABO/RH(D): O POS

## 2020-11-30 MED ORDER — METRONIDAZOLE 500 MG PO TABS: 500.0000 mg | ORAL_TABLET | Freq: Two times a day (BID) | ORAL | 0 refills | Status: DC

## 2020-11-30 NOTE — MAU Provider Note (Signed)
History     CSN: [id number]  Arrival date and time: 11/29/20 2139   Event Date/Time   First Provider Initiated Contact with Patient 11/30/20 0024      Chief Complaint  Patient presents with  . Vaginal Bleeding  . Routine Prenatal Visit  . Abdominal Pain   HPI Sarah Copeland is a 25 y.o. G3P2002 at [redacted]w[redacted]d who presents to MAU with chief complaint of vaginal spotting. This is a new problem, onset today 11/29/2020. Patient endorses seeing smears of blood when she wipes after voiding. She denies heavy vaginal bleeding, dysuria, abdominal tenderness, fever.  Patient also c/o lower abdominal cramping. This is a new problem, onset coinciding with onset of cramping. Her pain score is 5/10. Her pain does not radiate. She denies aggravating or alleviating factors. She has not taken medication or tried other treatments for this complaint.   Patient is s/p diagnosis of UTI following a visit to Urgent Care on 11/21/2020. She was prescribed Macrobid and states she has one pill left.   Patient has not selected a prenatal care team for this pregnancy and requests a list of options.  OB History    Gravida  3   Para  2   Term  2   Preterm      AB      Living  2     SAB      IAB      Ectopic      Multiple      Live Births  2           Past Medical History:  Diagnosis Date  . Medical history non-contributory     No past surgical history on file.  No family history on file.  Social History   Tobacco Use  . Smoking status: Never Smoker  . Smokeless tobacco: Never Used  Substance Use Topics  . Alcohol use: Never  . Drug use: Never    Allergies:  Allergies  Allergen Reactions  . Amoxicillin Anaphylaxis    Medications Prior to Admission  Medication Sig Dispense Refill Last Dose  . lidocaine (XYLOCAINE) 2 % solution Use as directed 10 mLs in the mouth or throat as needed for mouth pain. 100 mL 0   . miconazole (MICOTIN) 2 % cream Apply 1 application topically 2  (two) times daily. 28.35 g 0   . nitrofurantoin, macrocrystal-monohydrate, (MACROBID) 100 MG capsule Take 1 capsule (100 mg total) by mouth 2 (two) times daily. 10 capsule 0     Review of Systems  Gastrointestinal: Positive for abdominal pain.  Genitourinary: Positive for vaginal bleeding.  All other systems reviewed and are negative.  Physical Exam   Blood pressure 139/69, pulse 92, temperature 98.9 F (37.2 C), temperature source Oral, resp. rate 19, height 5\' 7"  (1.702 m), weight 92 kg, last menstrual period 10/20/2020, SpO2 100 %.  Physical Exam Vitals and nursing note reviewed. Exam conducted with a chaperone present.  Constitutional:      Appearance: She is well-developed. She is not ill-appearing.  Cardiovascular:     Rate and Rhythm: Normal rate.     Heart sounds: Normal heart sounds.  Pulmonary:     Effort: Pulmonary effort is normal.     Breath sounds: Normal breath sounds.  Abdominal:     General: Bowel sounds are normal.     Palpations: Abdomen is soft.     Tenderness: There is no abdominal tenderness.  Genitourinary:    Comments: Deferred Skin:  Capillary Refill: Capillary refill takes less than 2 seconds.  Neurological:     Mental Status: She is alert and oriented to person, place, and time.     MAU Course  Procedures  Orders Placed This Encounter  Procedures  . Wet prep, genital  . US OB LESS THAN 14 WEEKS WITH OB TRANSVAGINAL  . US OB Transvaginal  . CBC  . Comprehensive metabolic panel  . hCG, quantitative, pregnancy  . Nursing communication  . ABO/Rh  . Discharge patient   Patient Vitals for the past 24 hrs:  BP Temp Temp src Pulse Resp SpO2 Height Weight  11/30/20 0034 139/69 -- -- 92 -- -- -- --  11/29/20 2351 118/86 98.9 F (37.2 C) Oral 85 19 100 % -- --  11/29/20 2145 -- 99 F (37.2 C) Oral -- -- -- 5\' 7"  (1.702 m) 92 kg  11/29/20 2144 126/80 -- -- (!) 104 16 99 % -- --   Results for orders placed or performed during the hospital  encounter of 11/29/20 (from the past 24 hour(s))  Wet prep, genital     Status: Abnormal   Collection Time: 11/29/20 11:14 PM  Result Value Ref Range   Yeast Wet Prep HPF POC NONE SEEN NONE SEEN   Trich, Wet Prep NONE SEEN NONE SEEN   Clue Cells Wet Prep HPF POC PRESENT (A) NONE SEEN   WBC, Wet Prep HPF POC MANY (A) NONE SEEN   Sperm NONE SEEN   CBC     Status: Abnormal   Collection Time: 11/29/20 11:29 PM  Result Value Ref Range   WBC 9.7 4.0 - 10.5 K/uL   RBC 4.82 3.87 - 5.11 MIL/uL   Hemoglobin 10.4 (L) 12.0 - 15.0 g/dL   HCT 01/29/21 (L) 40.9 - 81.1 %   MCV 66.0 (L) 80.0 - 100.0 fL   MCH 21.6 (L) 26.0 - 34.0 pg   MCHC 32.7 30.0 - 36.0 g/dL   RDW 91.4 78.2 - 95.6 %   Platelets 304 150 - 400 K/uL   nRBC 0.0 0.0 - 0.2 %  Comprehensive metabolic panel     Status: Abnormal   Collection Time: 11/29/20 11:29 PM  Result Value Ref Range   Sodium 134 (L) 135 - 145 mmol/L   Potassium 3.6 3.5 - 5.1 mmol/L   Chloride 103 98 - 111 mmol/L   CO2 26 22 - 32 mmol/L   Glucose, Bld 84 70 - 99 mg/dL   BUN 9 6 - 20 mg/dL   Creatinine, Ser 01/29/21 0.44 - 1.00 mg/dL   Calcium 9.3 8.9 - 0.86 mg/dL   Total Protein 7.6 6.5 - 8.1 g/dL   Albumin 3.5 3.5 - 5.0 g/dL   AST 14 (L) 15 - 41 U/L   ALT 11 0 - 44 U/L   Alkaline Phosphatase 75 38 - 126 U/L   Total Bilirubin 0.2 (L) 0.3 - 1.2 mg/dL   GFR, Estimated 57.8 >46 mL/min   Anion gap 5 5 - 15  hCG, quantitative, pregnancy     Status: Abnormal   Collection Time: 11/29/20 11:29 PM  Result Value Ref Range   hCG, Beta Chain, Quant, S 18,783 (H) <5 mIU/mL  ABO/Rh     Status: None   Collection Time: 11/29/20 11:29 PM  Result Value Ref Range   ABO/RH(D) O POS    No rh immune globuloin      NOT A RH IMMUNE GLOBULIN CANDIDATE, PT RH POSITIVE Performed at [facility],  [address], [place], [place] [address]    US OB LESS THAN 14 WEEKS WITH OB TRANSVAGINAL  Result Date: 11/30/2020 CLINICAL DATA:  Cramping and lower back pain with some vaginal  bleeding. EXAM: OBSTETRIC <14 WK [place] AND TRANSVAGINAL OB US TECHNIQUE: Both transabdominal and transvaginal ultrasound examinations were performed for complete evaluation of the gestation as well as the maternal uterus, adnexal regions, and pelvic cul-de-sac. Transvaginal technique was performed to assess early pregnancy. COMPARISON:  None. FINDINGS: Intrauterine gestational sac: Single Yolk sac:  Visualized. Embryo:  Not Visualized. Cardiac Activity: Not Visualized. Heart Rate: N/A  bpm MSD: 11.4 mm   5 w   6 d Subchorionic hemorrhage:  None visualized. Maternal uterus/adnexae: The bilateral ovaries are visualized and are normal in appearance. No pelvic free fluid is seen. IMPRESSION: Single intrauterine gestational sac and yolk sac, at approximately 5 weeks and 6 days gestation by ultrasound evaluation, without visualization of a fetal pole. While this is likely secondary to early intrauterine pregnancy, correlation with follow-up pelvic ultrasound is recommended. Electronically Signed   By: Aram Candela M.D.   On: 11/30/2020 00:11   Meds ordered this encounter  Medications  . metroNIDAZOLE (FLAGYL) 500 MG tablet    Sig: Take 1 tablet (500 mg total) by mouth 2 (two) times daily.    Dispense:  14 tablet    Refill:  0    Order Specific Question:   Supervising Provider    Answer:   Warden Fillers [[id number]]   Assessment and Plan  --25 y.o. [id number] with IUP --Bacterial Vaginosis --Blood type O POS --Tylenol PRN for abdominal cramping --Discharge home in stable condition with first trimester precautions  F/U: --Order placed for viability scan in 10-14 days  Calvert Cantor, CNM 11/30/2020, 1:52 AM

## 2020-11-30 NOTE — Discharge Instructions (Signed)
https://www.cdc.gov/pregnancy/infections.html">  First Trimester of Pregnancy  The first trimester of pregnancy starts on the first day of your last menstrual period until the end of week 12. This is also called months 1 through 3 of pregnancy. Body changes during your first trimester Your body goes through many changes during pregnancy. The changes usually return to normal after your baby is born. Physical changes  You may gain or lose weight.  Your breasts may grow larger and hurt. The area around your nipples may get darker.  Dark spots or blotches may develop on your face.  You may have changes in your hair. Health changes  You may feel like you might vomit (nauseous), and you may vomit.  You may have heartburn.  You may have headaches.  You may have trouble pooping (constipation).  Your gums may bleed. Other changes  You may get tired easily.  You may pee (urinate) more often.  Your menstrual periods will stop.  You may not feel hungry.  You may want to eat certain kinds of food.  You may have changes in your emotions from day to day.  You may have more dreams. Follow these instructions at home: Medicines  Take over-the-counter and prescription medicines only as told by your doctor. Some medicines are not safe during pregnancy.  Take a prenatal vitamin that contains at least 600 micrograms (mcg) of folic acid. Eating and drinking  Eat healthy meals that include: ? Fresh fruits and vegetables. ? Whole grains. ? Good sources of protein, such as meat, eggs, or tofu. ? Low-fat dairy products.  Avoid raw meat and unpasteurized juice, milk, and cheese.  If you feel like you may vomit, or you vomit: ? Eat 4 or 5 small meals a day instead of 3 large meals. ? Try eating a few soda crackers. ? Drink liquids between meals instead of during meals.  You may need to take these actions to prevent or treat trouble pooping: ? Drink enough fluids to keep your pee  (urine) pale yellow. ? Eat foods that are high in fiber. These include beans, whole grains, and fresh fruits and vegetables. ? Limit foods that are high in fat and sugar. These include fried or sweet foods. Activity  Exercise only as told by your doctor. Most people can do their usual exercise routine during pregnancy.  Stop exercising if you have cramps or pain in your lower belly (abdomen) or low back.  Do not exercise if it is too hot or too humid, or if you are in a place of great height (high altitude).  Avoid heavy lifting.  If you choose to, you may have sex unless your doctor tells you not to. Relieving pain and discomfort  Wear a good support bra if your breasts are sore.  Rest with your legs raised (elevated) if you have leg cramps or low back pain.  If you have bulging veins (varicose veins) in your legs: ? Wear support hose as told by your doctor. ? Raise your feet for 15 minutes, 3-4 times a day. ? Limit salt in your food. Safety  Wear your seat belt at all times when you are in a car.  Talk with your doctor if someone is hurting you or yelling at you.  Talk with your doctor if you are feeling sad or have thoughts of hurting yourself. Lifestyle  Do not use hot tubs, steam rooms, or saunas.  Do not douche. Do not use tampons or scented sanitary pads.  Do not   use herbal medicines, illegal drugs, or medicines that are not approved by your doctor. Do not drink alcohol.  Do not smoke or use any products that contain nicotine or tobacco. If you need help quitting, ask your doctor.  Avoid cat litter boxes and soil that is used by cats. These carry germs that can cause harm to the baby and can cause a loss of your baby by miscarriage or stillbirth. General instructions  Keep all follow-up visits. This is important.  Ask for help if you need counseling or if you need help with nutrition. Your doctor can give you advice or tell you where to go for help.  Visit your  dentist. At home, brush your teeth with a soft toothbrush. Floss gently.  Write down your questions. Take them to your prenatal visits. Where to find more information  American Pregnancy Association: americanpregnancy.org  [employer]: www.acog.org  Office on Women's Health: MightyReward.co.nz Contact a doctor if:  You are dizzy.  You have a fever.  You have mild cramps or pressure in your lower belly.  You have a nagging pain in your belly area.  You continue to feel like you may vomit, you vomit, or you have watery poop (diarrhea) for 24 hours or longer.  You have a bad-smelling fluid coming from your vagina.  You have pain when you pee.  You are exposed to a disease that spreads from person to person, such as chickenpox, measles, Zika virus, HIV, or hepatitis. Get help right away if:  You have spotting or bleeding from your vagina.  You have very bad belly cramping or pain.  You have shortness of breath or chest pain.  You have any kind of injury, such as from a fall or a car crash.  You have new or increased pain, swelling, or redness in an arm or leg. Summary  The first trimester of pregnancy starts on the first day of your last menstrual period until the end of week 12 (months 1 through 3).  Eat 4 or 5 small meals a day instead of 3 large meals.  Do not smoke or use any products that contain nicotine or tobacco. If you need help quitting, ask your doctor.  Keep all follow-up visits. This information is not intended to replace advice given to you by your health care provider. Make sure you discuss any questions you have with your health care provider. Document Revised: 02/15/2020 Document Reviewed: 12/22/2019 Elsevier Patient Education  2021 Elsevier Inc.  Safe Medications in Pregnancy   Acne: Benzoyl Peroxide Salicylic Acid  Backache/Headache: Tylenol: 2 regular strength every 4 hours OR              2  Extra strength every 6 hours  Colds/Coughs/Allergies: Benadryl (alcohol free) 25 mg every 6 hours as needed Breath right strips Claritin Cepacol throat lozenges Chloraseptic throat spray Cold-Eeze- up to three times per day Cough drops, alcohol free Flonase (by prescription only) Guaifenesin Mucinex Robitussin DM (plain only, alcohol free) Saline nasal spray/drops Sudafed (pseudoephedrine) & Actifed ** use only after [redacted] weeks gestation and if you do not have high blood pressure Tylenol Vicks Vaporub Zinc lozenges Zyrtec   Constipation: Colace Ducolax suppositories Fleet enema Glycerin suppositories Metamucil Milk of magnesia Miralax Senokot Smooth move tea  Diarrhea: Kaopectate Imodium A-D  *NO pepto Bismol  Hemorrhoids: Anusol Anusol HC Preparation H Tucks  Indigestion: Tums Maalox Mylanta Zantac  Pepcid  Insomnia: Benadryl (alcohol free) 25mg  every 6 hours as  needed Tylenol PM Unisom, no Gelcaps  Leg Cramps: Tums MagGel  Nausea/Vomiting:  Bonine Dramamine Emetrol Ginger extract Sea bands Meclizine  Nausea medication to take during pregnancy:  Unisom (doxylamine succinate 25 mg tablets) Take one tablet daily at bedtime. If symptoms are not adequately controlled, the dose can be increased to a maximum recommended dose of two tablets daily (1/2 tablet in the morning, 1/2 tablet mid-afternoon and one at bedtime). Vitamin B6 100mg  tablets. Take one tablet twice a day (up to 200 mg per day).  Skin Rashes: Aveeno products Benadryl cream or 25mg  every 6 hours as needed Calamine Lotion 1% cortisone cream  Yeast infection: Gyne-lotrimin 7 Monistat 7   **If taking multiple medications, please check labels to avoid duplicating the same active ingredients **take medication as directed on the label ** Do not exceed 4000 mg of tylenol in 24 hours **Do not take medications that contain aspirin or ibuprofen   Prenatal Care  Providers           [facility] @ [facility]  [address] [phone]  [facility] for [facility] @ [facility]   [address]  [phone]  [facility] @ [facility]       [address] [phone]            [facility] for [facility] @ [place]     [phone]          [facility] for [facility] @ [facility]   [address] [phone]  [facility] for [facility] @ [facility]  [address] [phone]     [facility] for [facility] @ [address] [place])  [address]   [phone]     [facility]  Phone: [phone]  [facility]  Phone: [phone]  [facility] Phone: [phone]  [facility] Phone: [phone]  [facility] Phone: [phone]  [facility] Phone: [phone]  [facility]  Phone: [phone]

## 2020-12-21 DIAGNOSIS — Z419 Encounter for procedure for purposes other than remedying health state, unspecified: Secondary | ICD-10-CM | POA: Diagnosis not present

## 2020-12-31 ENCOUNTER — Encounter: Payer: Self-pay | Admitting: Obstetrics and Gynecology

## 2020-12-31 ENCOUNTER — Ambulatory Visit (INDEPENDENT_AMBULATORY_CARE_PROVIDER_SITE_OTHER): Payer: Medicaid Other | Admitting: Obstetrics and Gynecology

## 2020-12-31 ENCOUNTER — Other Ambulatory Visit (HOSPITAL_COMMUNITY)
Admission: RE | Admit: 2020-12-31 | Discharge: 2020-12-31 | Disposition: A | Discharge status: Home / Self Care | Payer: Medicaid Other | Source: Ambulatory Visit | Attending: Obstetrics and Gynecology | Admitting: Obstetrics and Gynecology

## 2020-12-31 VITALS — BP 120/75 | Wt 228.4 lb

## 2020-12-31 DIAGNOSIS — N898 Other specified noninflammatory disorders of vagina: Secondary | ICD-10-CM | POA: Diagnosis present

## 2020-12-31 DIAGNOSIS — Z3480 Encounter for supervision of other normal pregnancy, unspecified trimester: Secondary | ICD-10-CM | POA: Diagnosis not present

## 2020-12-31 DIAGNOSIS — Z98891 History of uterine scar from previous surgery: Secondary | ICD-10-CM | POA: Insufficient documentation

## 2020-12-31 DIAGNOSIS — O0990 Supervision of high risk pregnancy, unspecified, unspecified trimester: Secondary | ICD-10-CM | POA: Insufficient documentation

## 2020-12-31 DIAGNOSIS — O26891 Other specified pregnancy related conditions, first trimester: Secondary | ICD-10-CM

## 2020-12-31 DIAGNOSIS — O09299 Supervision of pregnancy with other poor reproductive or obstetric history, unspecified trimester: Secondary | ICD-10-CM | POA: Insufficient documentation

## 2020-12-31 DIAGNOSIS — Z3A10 10 weeks gestation of pregnancy: Secondary | ICD-10-CM | POA: Insufficient documentation

## 2020-12-31 MED ORDER — ASPIRIN 81 MG PO CHEW: 81.0000 mg | CHEWABLE_TABLET | Freq: Every day | ORAL | 5 refills | Status: DC

## 2020-12-31 MED ORDER — PRENATAL PLUS 27-1 MG PO TABS
1.0000 | ORAL_TABLET | Freq: Every day | ORAL | 11 refills | Status: DC
Start: 2020-12-31 — End: 2021-09-04

## 2020-12-31 NOTE — Progress Notes (Signed)
INITIAL PRENATAL VISIT NOTE  Subjective:  Sarah Copeland is a 25 y.o. G3P2002 at [redacted]w[redacted]d by LMP c/w early u/s being seen today for her initial prenatal visit.  She has an obstetric history significant for c/s x 2. She has a medical history significant for 2 children with ? Low WBC count, they have not been diagnosed with leukemia at this time.  Patient reports no complaints.  Contractions: Not present. Vag. Bleeding: None.  Movement: Absent. Denies leaking of fluid.    Past Medical History:  Diagnosis Date  . Anemia   . Medical history non-contributory     Past Surgical History:  Procedure Laterality Date  . CESAREAN SECTION      OB History  Gravida Para Term Preterm AB Living  3 2 2     2   SAB IAB Ectopic Multiple Live Births          2    # Outcome Date GA Lbr Len/2nd Weight Sex Delivery Anes PTL Lv  3 Current           2 Term 2020     CS-Unspec   LIV  1 Term 2018     CS-Unspec   LIV    Social History   Socioeconomic History  . Marital status: Single    Spouse name: Not on file  . Number of children: Not on file  . Years of education: Not on file  . Highest education level: Not on file  Occupational History  . Not on file  Tobacco Use  . Smoking status: Never Smoker  . Smokeless tobacco: Never Used  Substance and Sexual Activity  . Alcohol use: Never  . Drug use: Never  . Sexual activity: Yes    Birth control/protection: None  Other Topics Concern  . Not on file  Social History Narrative  . Not on file   Social Determinants of Health   Financial Resource Strain: Not on file  Food Insecurity: Not on file  Transportation Needs: Not on file  Physical Activity: Not on file  Stress: Not on file  Social Connections: Not on file    History reviewed. No pertinent family history.   Current Outpatient Medications:  .  aspirin 81 MG chewable tablet, Chew 1 tablet (81 mg total) by mouth daily., Disp: 30 tablet, Rfl: 5 .  prenatal vitamin w/FE, FA  (PRENATAL 1 + 1) 27-1 MG TABS tablet, Take 1 tablet by mouth daily at 12 noon., Disp: 30 tablet, Rfl: 11 .  lidocaine (XYLOCAINE) 2 % solution, Use as directed 10 mLs in the mouth or throat as needed for mouth pain. (Patient not taking: Reported on 12/31/2020), Disp: 100 mL, Rfl: 0 .  metroNIDAZOLE (FLAGYL) 500 MG tablet, Take 1 tablet (500 mg total) by mouth 2 (two) times daily., Disp: 14 tablet, Rfl: 0 .  miconazole (MICOTIN) 2 % cream, Apply 1 application topically 2 (two) times daily., Disp: 28.35 g, Rfl: 0 .  nitrofurantoin, macrocrystal-monohydrate, (MACROBID) 100 MG capsule, Take 1 capsule (100 mg total) by mouth 2 (two) times daily., Disp: 10 capsule, Rfl: 0  Allergies  Allergen Reactions  . Amoxicillin Anaphylaxis    Review of Systems: Negative except for what is mentioned in HPI.  Objective:   Vitals:   12/31/20 1503  BP: 120/75  Weight: 228 lb 6.4 oz (103.6 kg)    Fetal Status:     Movement: Absent     Physical Exam: BP 120/75   Wt 228 lb 6.4 oz (  103.6 kg)   LMP 10/20/2020 (Approximate)   BMI 35.77 kg/m  CONSTITUTIONAL: Well-developed, well-nourished female in no acute distress.  NEUROLOGIC: Alert and oriented to person, place, and time. Normal reflexes, muscle tone coordination. No cranial nerve deficit noted. PSYCHIATRIC: Normal mood and affect. Normal behavior. Normal judgment and thought content. SKIN: Skin is warm and dry. No rash noted. Not diaphoretic. No erythema. No pallor. HENT:  Normocephalic, atraumatic, External right and left ear normal. Oropharynx is clear and moist EYES: Conjunctivae and EOM are normal. Pupils are equal, round, and reactive to light. No scleral icterus.  NECK: Normal range of motion, supple, no masses CARDIOVASCULAR: Normal heart rate noted, regular rhythm RESPIRATORY: Effort and breath sounds normal, no problems with respiration noted BREASTS: symmetric, non-tender, no masses palpable ABDOMEN: Soft, nontender, nondistended,  gravid. GU: normal appearing external female genitalia, normal appearing cervix, scant white discharge in vagina, no lesions noted, pap taken Bimanual: 10 weeks sized uterus, no adnexal tenderness or palpable lesions noted MUSCULOSKELETAL: Normal range of motion. EXT:  No edema and no tenderness. 2+ distal pulses.  Bedside u/s showed fetal movement and fetal heart motion  Assessment and Plan:  Pregnancy: G3P2002 at [redacted]w[redacted]d by LMP  1. Supervision of other normal pregnancy, antepartum Pt started on baby ASA - CBC/D/Plt+RPR+Rh+ABO+Rub Ab... - CHL AMB BABYSCRIPTS SCHEDULE OPTIMIZATION - Culture, OB Urine - [place] MFM OB COMP + 14 WK; Future - Genetic Screening - Hemoglobin A1c - Cytology - PAP( [place]) - prenatal vitamin w/FE, FA (PRENATAL 1 + 1) 27-1 MG TABS tablet; Take 1 tablet by mouth daily at 12 noon.  Dispense: 30 tablet; Refill: 11  2. Discharge from the vagina  - Cervicovaginal ancillary only( [place])  3. [redacted] weeks gestation of pregnancy   4. Vaginal discharge during pregnancy in first trimester   5. History of 2 cesarean sections Will need possible TOLAC counseling later in pregnancy  6. Hx of preeclampsia, prior pregnancy, currently pregnant  - aspirin 81 MG chewable tablet; Chew 1 tablet (81 mg total) by mouth daily.  Dispense: 30 tablet; Refill: 5   Preterm labor symptoms and general obstetric precautions including but not limited to vaginal bleeding, contractions, leaking of fluid and fetal movement were reviewed in detail with the patient.  Please refer to After Visit Summary for other counseling recommendations.   Return in about 4 weeks (around 01/28/2021) for ROB, in person.  Warden Fillers 12/31/2020 3:49 PM

## 2020-12-31 NOTE — Patient Instructions (Signed)
AREA PEDIATRIC/FAMILY PRACTICE PHYSICIANS  [place] ([address]) . [facility] o Chambliss, MD; Eniola, MD; Hale, MD; Hensel, MD; McDiarmid, MD; McIntyer, MD; Xyon Lukasik, MD; Walden, MD o [address] [place], [place] [address] o [phone] o Mon-Fri 8:30-12:30, 1:30-5:00 o Providers come to see babies at [facility] o Accepting Medicaid . [facility] o Limited providers who accept newborns: Koirala, MD; Morrow, MD; Wolters, MD o [address], [place], [place] [address] o [phone] o Mon-Fri 8:00-5:30 o Babies seen by providers at [facility] o Does NOT accept Medicaid o Please call early in hospitalization for appointment (limited availability)  . [facility] o Mulberry, MD o [address] [place], [place] [address] o [phone] o Mon, Tue, Thur, Fri 8:30-5:00, Wed 10:00-7:00 (closed 1-2pm) o Babies seen by [facility] providers o Accepting Medicaid . [facility] o Rubin, MD o [address], [place], [place] [address] o [phone] o Mon-Fri 8:30-5:00, Sat 8:30-12:00 o Provider comes to see babies at [facility] o Accepting Medicaid o Must have been referred from current patients or contacted office prior to delivery . [facility] ([facility] o Brown, MD; Chandler, MD; Ettefagh, MD; Grant, MD; Lester, MD; McCormick, MD; McQueen, MD; Prose, MD; Simha, MD; Stanley, MD; Stryffeler, NP; Tebben, NP o [address], [place], [place] [address] o [phone] o Mon, Tue, Thur, Fri 8:30-5:30, Wed 9:30-5:30, Sat 8:30-12:30 o Babies seen by [facility] providers o Accepting Medicaid o Only accepting infants of first-time parents or siblings of current patients o Hospital discharge coordinator will make follow-up appointment . Jack Amos o [address],  [place], [place]  [address] o [phone]   Fax - [phone] . [facility] o [address], [place], [place]  [address] o Phone - [phone]   Fax - [phone] . Shilpa Gosrani o [address], [place], [place]  [address] o [phone]  [place] ([address]) . [facility] o Bates, MD; [place]assfield, MD; Cooper, Cox, MD; MD; Davis, MD; Dovico, MD; Ettefaugh, MD; Little, MD; Lowe, MD; Keiffer, MD; Melvin, MD; Sumner, MD; Williams, MD o [address], [place], [place] [address] o [phone] o Mon-Fri 8:30-5:00 (extended evenings Mon-Thur as needed), Sat-Sun 10:00-1:00 o Providers come to see babies at [facility] o Accepting Medicaid for families of first-time babies and families with all children in the household age 3 and under. Must register with office prior to making appointment (M-F only). . [facility] o Henson, NP; Knapp, MD; Lalonde, MD; Tysinger, PA o [address] [place], [place] [address] o [phone] o Mon-Fri 8:00-5:00 o Babies seen by providers at [facility] o Does NOT accept Medicaid/Commercial Insurance Only . [facility] ([facility]  o Artis, MD; Barnes, MD; Bratton, MD; Coccaro, MD; Lockett Gardner, MD; Kramer, MD; Marshall, MD; Netherton, MD; Poleto, MD; Skinner, MD o [address] [place], [place] [address] o [phone] o Mon-Fri 8:30-5:30, Sat (Oct.-Mar.) 9:00-1:00 o Babies seen by providers at [facility] o Accepting Medicaid  [place] ([address]) . [facility] o Reid, MD; Warner, MD o [address], [place], [place] [address] o [phone] o Mon-Fri 8:30-5:00, Sat 8:30-12:00 o Providers come to see babies at [facility] o Does NOT accept Medicaid . [facility] o Becker, PA; Hagler, MD; Scifres, PA; Sun, MD; Swayne, MD o [address],  [place], [place] [address] o [phone] o Mon-Fri 8:00-5:00 o Babies seen by providers at [facility] o Does NOT accept Medicaid o Only accepting babies of parents who are patients o Please call early in hospitalization for appointment (limited availability) . [facility] o Clark, MD; Frye, MD; Kelleher, MD; Mack, NP; Miller, MD; O'Keller, MD; Patterson, NP; Pudlo, MD; Puzio, MD; Thomas, MD; Tucker, MD; Twiselton, MD o [address], [place], [place] [address] o [phone] o Mon-Fri 8:00-5:00, Sat 9:00-12:00 o Providers come to see babies at [facility] o Does NOT accept Medicaid  [place] ([address]) . [facility] o Limited providers accepting new patients: Brake, NP; Wharton, PA o [address], [place], [place] [address] o [phone] o Mon-Fri 8:00-5:00 o Babies seen by providers at [facility] o Does NOT accept Medicaid o Only accepting babies of parents who are patients o Please call early in hospitalization for appointment (limited availability) . [facility] o Gay, MD; Quinlan, MD o [address] [place], [place] [address] o [phone] (press [phone] to schedule appointment) o Mon-Fri 8:00-5:00 o Providers come to see babies at [facility] o Does NOT accept Medicaid . [facility] o Mazer, MD o [address] [place], [place] [address] o [phone] o Mon-Fri 8:30-5:00 (lunch 12:30-1:00), extended hours by appointment only Wed 5:00-6:30 o Babies seen by [facility] providers o Accepting Medicaid . [facility] o Banks, MD; Jordan, MD; Koberlein, MD o [address], [place], [place] [address] o [phone] o Mon-Fri 8:00-5:00 o Babies seen by [facility] providers o Does NOT accept Medicaid . [facility] o Parker, MD; Hunter, MD; Wallace, DO o [address] [place], [place]  [address] o [phone] o Mon-Fri 8:00-5:00 o Babies seen by [facility] providers o Does NOT accept Medicaid . [facility] o Brandon, PA; Brecken, PA; Christy, NP; Dees, MD; DeClaire, MD; DeWeese, MD; Hansen, NP; Mills, NP; Parrish, NP; Smoot, NP; Summer, MD; Vapne, MD o [address] [place], [place] [address] o [phone] o Mon-Fri 8:30-5:00, Sat 10:00-1:00 o Providers come to see babies at [facility] o Does NOT accept Medicaid o Free prenatal information session Tuesdays at 4:45pm . [facility] o Bouska, MD; Gordon, PA; Jeffery, PA; Weber, PA o [address] [place] [address] o [phone] o Mon-Fri 7:30-5:30 o Babies seen by [facility] providers . [facility] o [address], [place], [place]  [address] o [phone]   Fax - [phone]  [place] ([address]) . [facility] o [place]ese, MD o [address] [place], [place] [address] o [phone] o Mon-Thur 8:00-6:00 o Providers come to see babies at [facility] o Accepting Medicaid . [facility] o Anderson, NP; [place], MD; Beal, PA; Spencer, PA o [address] [place], [place] [address] o [phone] o Mon-Thur 7:30-7:30, Fri 7:30-4:30 o Babies seen by [facility] providers o Accepting Medicaid . [facility] o Agbuya, MD; Klett, NP; Romgoolam, MD o [address], [place],  [address] o [phone] o Mon-Fri 8:30-5:00, Sat 8:30-12:00 o Providers come to see babies at [facility] o Accepting Medicaid o Must have "Meet & Greet" appointment at office prior to delivery . [facility] o McCord,   MD; Wallace, MD; Wood, MD o [address], [place], [place] [address] o [phone] o Mon-Wed 8:00-6:00, Thur-Fri 8:00-5:00, Sat 9:00-12:00 o Providers come to  see babies at [facility] o Does NOT accept Medicaid o Only accepting siblings of current patients . [facility]  o [address], [place], [place]  [address] o [phone]   Fax - [phone] . [facility] o [address], [place], [place]  [address] o [phone]   Fax - [phone]  [place] ([address] & [address]) . [facility] o Cirigliano, DO; Matthews, DO o [address] [place], [place] [address] o [phone] o Mon-Fri 7:00-5:00 o Babies seen by [facility] providers o Does NOT accept Medicaid . [facility] o Briscoe, MD; Howley, PA; Moreira, PA o [address], [place], [place] [address] o [phone] o Mon-Fri 8:00-5:00 o Babies seen by [facility] providers o Accepting Medicaid . [facility] o Boyd, MD; Church, PA; Jones, NP; Osborn, PA o [address], [place], [place] [address] o [phone] o Mon-Fri 8:00-5:00 o Babies seen by providers at [facility] o Accepting Medicaid  [place] ([address]) . [facility] [place] o [place], DO o [address] [place], [place] [address] o [phone] o Mon-Fri 8:00-5:00 o Babies seen by [facility] providers o Does NOT accept Medicaid o Limited availability, please call early in hospitalization to schedule follow-up . [facility] o Calderon, PA; Cummings, MD; Dillard, MD; Martin, PA; Olson, MD; VanDeven, PA o [address], [place], [place] [address] o [phone] o Mon-Fri 8:30-5:00, Sat 9:00-12:00 o Babies seen by providers at [facility] o Accepting Medicaid o Please register online then schedule online or call office o [email or web address] . [facility] o Hunter, NP; Kumar, MD; Martin Rogers, PA o [address], [place], [place] [address] o [phone] o Mon-Fri 8:00-5:00 o Babies seen by providers at [facility] o Accepting Medicaid . [facility] o Kingman, MD; Kristi Fleenor, NP; West, MD o [address], [place], [place] [address] o [phone] o Mon-Fri 8:00-5:30, Sat&Sun by appointment (phones open at 8:30) o Babies seen by [facility] providers o Accepting Medicaid o Must be a first-time baby or sibling of current patient . [facility] [place]  o [address], [place], [place]  [address] o [phone]   Fax - [phone]  [place] ([address]) . [facility] o Brown, PA; Cowen, PA; Rice, MD; Helton, PA; Spry, MD o [address] [place], [place] [address] o [phone] o Mon-Thur 8:00-7:00, Fri 8:00-5:00, Sat 8:00-12:00, Sun 9:00-12:00 o Babies seen by [facility] providers o Accepting Medicaid . [facility] o Coe-Goins, MD; Marshall, MD; Pierre-Louis, MD o [address], [place], [place] [address] o [phone] o Mon-Thur 8:00-5:00 o Babies seen by providers at [facility] o Accepting Medicaid . [facility] o Bratton, MD; Coe-Goins, MD; Hayes, MD; Lewis, MD; List, MD; Lott, MD; Marshall, MD; Moran, MD; O'Takako Minckler, MD; Pierre-Louis, MD; Pitonzo, MD; Scholer, MD; Spangle, MD o [address] [place], [place]   [address] o [phone] o Mon-Fri 8:00-5:30, Sat (Oct.-Mar.) 9:00-1:00 o Babies seen by providers at [facility] o Accepting Medicaid o Must fill out new patient packet, available online at [email or web address] . [facility] ([facility] o Friddle, NP; Harris, NP; Kelly, NP; Logan, MD;  Melvin, PA; Poth, MD; Ramadoss, MD; Stanton, NP o [address], [place], [place] [address] o [phone] o Mon-Thur 8:00-5:30, Fri 8:00-5:00 o Babies seen by providers at [facility] o Accepting Medicaid  [place] ([address]) . [facility] o Dixon, PA; Simms, MD; Pickard, MD; Tapia, PA o [address], [place], [place] [address] o [phone] o Mon-Fri 8:00-5:00 o Babies seen by providers at [facility] o Accepting Medicaid   [place] ([address]) . [facility] [place] o Masneri, DO; Meyers, MD; Nelson, PA o [address], [place], [place] [address] o [phone] o Mon-Fri 8:00-5:00 o Babies seen by providers at [facility] o Does NOT accept Medicaid o Limited appointment availability, please call early in hospitalization  . [facility] [place] o Kunedd, DO; McGowen, MD o [address], [place], [place] [address] o [phone] o Mon-Fri 8:00-5:00 o Babies seen by [facility] providers o Does NOT accept Medicaid . [facility] [place] o Cameron, MD; MacDonald, MD; Michaels, PA; Nayak, MD o [address] [place] [address], [place], [place] [address] o [phone] o Mon-Fri 8:00-5:00 o After hours clinic ([address] [place], [place] [address]) [phone] Mon-Fri 5:00-8:00, Sat 12:00-6:00, Sun 10:00-4:00 o Babies seen by [facility] providers o Accepting Medicaid . [facility] [place] o [address], [place], [place]  [address] o [phone]   Fax - [phone]  [place] ([address]) . [facility] [place] [facility] o Andy, MD o [address], [place], [place] [address] o [phone] o Mon-Fri 8:00-5:00 o Babies seen by [facility] providers o Does NOT accept Medicaid . [facility] [place] ([facility] [place]) o Eksir, MD o [address], [place], [place]  [address] o [phone] o Mon-Thur 8:00-7:00, Fri 8:00-5:00, Sat 8:00-12:00 o Babies seen by providers at [facility] o Accepting Medicaid - but does not have vaccinations in office (must be received elsewhere) o Limited availability, please call early in hospitalization  [place] ([address]) . [facility]  o Charlene Flemming, MD o [address], [place] [address] o [phone]  Fax [phone]   

## 2021-01-01 LAB — CERVICOVAGINAL ANCILLARY ONLY
Bacterial Vaginitis (gardnerella): POSITIVE — AB
Candida Glabrata: NEGATIVE
Candida Vaginitis: POSITIVE — AB
Chlamydia: NEGATIVE
Comment: NEGATIVE
Comment: NEGATIVE
Comment: NEGATIVE
Comment: NEGATIVE
Comment: NEGATIVE
Comment: NORMAL
Neisseria Gonorrhea: NEGATIVE
Trichomonas: NEGATIVE

## 2021-01-02 LAB — URINE CULTURE, OB REFLEX

## 2021-01-02 LAB — CULTURE, OB URINE

## 2021-01-03 LAB — CYTOLOGY - PAP
Comment: NEGATIVE
Diagnosis: UNDETERMINED — AB
High risk HPV: POSITIVE — AB

## 2021-01-08 ENCOUNTER — Other Ambulatory Visit: Payer: Medicaid Other

## 2021-01-08 ENCOUNTER — Encounter: Payer: Self-pay | Admitting: General Practice

## 2021-01-08 ENCOUNTER — Other Ambulatory Visit: Payer: Self-pay

## 2021-01-08 ENCOUNTER — Telehealth: Payer: Self-pay | Admitting: Family Medicine

## 2021-01-08 DIAGNOSIS — Z3480 Encounter for supervision of other normal pregnancy, unspecified trimester: Secondary | ICD-10-CM | POA: Diagnosis not present

## 2021-01-08 NOTE — Telephone Encounter (Signed)
Patient was here today for a Lab appointment, state she never received her BP Cuff and she also want to speak to someone regarding her lost of appetite

## 2021-01-08 NOTE — Telephone Encounter (Signed)
Returned patient call. Was not able to reach patient. Unable to leave message as mailbox is full and not able to receive messages.  My Chart message sent.

## 2021-01-09 LAB — CBC/D/PLT+RPR+RH+ABO+RUB AB...
Antibody Screen: NEGATIVE
Basophils Absolute: 0 10*3/uL (ref 0.0–0.2)
Basos: 0 %
EOS (ABSOLUTE): 0.1 10*3/uL (ref 0.0–0.4)
Eos: 1 %
HCV Ab: <0.1 s/co ratio (ref 0.0–0.9)
HIV Screen 4th Generation wRfx: NONREACTIVE
Hematocrit: 33.2 % — ABNORMAL LOW (ref 34.0–46.6)
Hemoglobin: 10.9 g/dL — ABNORMAL LOW (ref 11.1–15.9)
Hepatitis B Surface Ag: NEGATIVE
Immature Grans (Abs): 0 10*3/uL (ref 0.0–0.1)
Immature Granulocytes: 0 %
Lymphocytes Absolute: 2.3 10*3/uL (ref 0.7–3.1)
Lymphs: 31 %
MCH: 22.4 pg — ABNORMAL LOW (ref 26.6–33.0)
MCHC: 32.8 g/dL (ref 31.5–35.7)
MCV: 68 fL — ABNORMAL LOW (ref 79–97)
Monocytes Absolute: 0.4 10*3/uL (ref 0.1–0.9)
Monocytes: 6 %
Neutrophils Absolute: 4.4 10*3/uL (ref 1.4–7.0)
Neutrophils: 62 %
Platelets: 291 10*3/uL (ref 150–450)
RBC: 4.86 x10E6/uL (ref 3.77–5.28)
RDW: 16.3 % — ABNORMAL HIGH (ref 11.7–15.4)
RPR Ser Ql: NONREACTIVE
Rh Factor: POSITIVE
Rubella Antibodies, IGG: 1.63 index (ref >0.99)
WBC: 7.2 10*3/uL (ref 3.4–10.8)

## 2021-01-09 LAB — HEMOGLOBIN A1C
Est. average glucose Bld gHb Est-mCnc: 97 mg/dL
Hgb A1c MFr Bld: 5 % (ref 4.8–5.6)

## 2021-01-09 LAB — HCV INTERPRETATION

## 2021-01-22 ENCOUNTER — Telehealth: Payer: Self-pay | Admitting: Lactation Services

## 2021-01-22 DIAGNOSIS — Z3480 Encounter for supervision of other normal pregnancy, unspecified trimester: Secondary | ICD-10-CM

## 2021-01-22 NOTE — Telephone Encounter (Signed)
Called patient to give her results of [facility] results. 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 She reports her older children are both Neutropenic, needing follow up frequently. The 2 older babies are from another partner. Patient is interested in meeting with Genetic Counselor at [facility].   She was given information to call [facility] at [phone] to set up Genetic Counseling telephone session.   Reviewed it will be recommended that father of the baby be tested to see if he carries the same gene. Reviewed we have Saliva Test kits in the office that can be obtained and sent for testing.   Ordered [facility]-Genetic Counseling Session for patient. Called [facility]FM to schedule Genetic Counseling Session, was scheduled for 5/4 at 9 am arrive at 8:45 am to MFM.

## 2021-01-23 ENCOUNTER — Other Ambulatory Visit: Payer: Self-pay

## 2021-01-23 ENCOUNTER — Ambulatory Visit: Payer: Medicaid Other | Attending: Obstetrics and Gynecology | Admitting: Genetic Counselor

## 2021-01-23 ENCOUNTER — Encounter: Payer: Self-pay | Admitting: Genetic Counselor

## 2021-01-23 ENCOUNTER — Telehealth: Payer: Self-pay | Admitting: Lactation Services

## 2021-01-23 DIAGNOSIS — D582 Other hemoglobinopathies: Secondary | ICD-10-CM | POA: Diagnosis not present

## 2021-01-23 DIAGNOSIS — Z148 Genetic carrier of other disease: Secondary | ICD-10-CM | POA: Diagnosis not present

## 2021-01-23 DIAGNOSIS — D563 Thalassemia minor: Secondary | ICD-10-CM

## 2021-01-23 DIAGNOSIS — Z315 Encounter for genetic counseling: Secondary | ICD-10-CM

## 2021-01-23 MED ORDER — TERCONAZOLE 0.4 % VA CREA: 1.0000 | TOPICAL_CREAM | Freq: Every day | VAGINAL | 0 refills | Status: DC

## 2021-01-23 MED ORDER — TINIDAZOLE 500 MG PO TABS: 2.0000 g | ORAL_TABLET | Freq: Every day | ORAL | 0 refills | Status: DC

## 2021-01-23 NOTE — Telephone Encounter (Signed)
Called patient in regards to + BV and yeast on lab results from 4/11. She is having a lot of discharge with a slight odor. She has had Flagyl in March. Will send in Tindamax as Patient recently treated with Flagyl and did not clear infection and is standing order.   Terconazole sent in per standing order for yeast infection.   Patient with no questions or concerns at this time.

## 2021-01-23 NOTE — Progress Notes (Signed)
01/23/2021  Tiffny Band [birth year] MRN: [id number] DOV: 01/23/2021  Ms. 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 Ms. Buren was accompanied to her appointment by her partner, Lanney Gins.   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 Palen is a [id number], 25 y.o. female. Her current pregnancy has completed [redacted]w[redacted]d (Estimated Date of Delivery: 07/26/21). Ms. Wisniewski has a 66 year old daughter and a 79 month old son from a prior relationship.  Ms. Stansbery denied exposure to environmental toxins or chemical agents. She denied the use of alcohol, tobacco or street drugs. She reported taking prenatal vitamins. She denied significant viral illnesses, fevers, and bleeding during the course of her pregnancy. She reported a personal history of anemia. Her children were born via Cesarean section. Her medical and surgical histories were otherwise noncontributory.  Family History  A three generation pedigree was drafted and reviewed. The family history is remarkable for the following:  - Ms. Beyersdorf's children have a history of neutropenia and anemia. The cause of their neutropenia/anemia is reportedly unknown.  The remaining family histories were reviewed and found to be noncontributory for birth defects, intellectual disability, recurrent pregnancy loss, and known genetic conditions. Ms. Harris had limited information about her paternal family history; thus, risk assessment was limited.  The patient's ancestry is African American. The father of the pregnancy's ancestry is African American. Ashkenazi Jewish ancestry and consanguinity were denied. Pedigree will be scanned under Media.  Discussion  Ms. Eggebrecht was referred for genetic counseling to discuss results from her Horizon-4 carrier screening.  Results from carrier screening revealed that Ms. 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  Hemoglobin C trait:  Firstly, we discussed hemoglobin C trait. Hemoglobin C is a variant form of hemoglobin caused by a specific mutation in the HBB gene. The HBB gene encodes for a protein called beta-globin, which is a subunit of hemoglobin. Hemoglobin within red blood cells binds to oxygen molecules in the lungs and delivers oxygen to tissues throughout the body. There are many different types of hemoglobin, with hemoglobin A being the most common form. Mutations in the HBB gene cause variant forms of hemoglobin to be produced, such as hemoglobin C and hemoglobin S. Individuals with hemoglobin C trait are carriers for hemoglobin C disease (HbC disease), whereas individuals with hemoglobin S trait are carriers for sickle cell disease. Since Ms. Barretto carries hemoglobin C trait, this indicates that she is a carrier for HbC disease.   Individuals with HbC disease have red blood cells that contain mostly hemoglobin C. Too much hemoglobin C can reduce the number and size of red blood cells in the body, causing mild anemia.  As a result, individuals with HbC disease may experience symptoms such as weakness, fatigue, and splenomegaly. HbC disease is inherited in an autosomal recessive pattern, where both parents must carry hemoglobin C trait to be at risk of having an affected child. If Ms. Batson's partner were also a carrier of HbC disease, they would have a 1 in 4 (25%) chance of having a child with HbC disease.    We also reviewed that it is possible that Ms. Inlow's partner could carry another variant form of hemoglobin, such as hemoglobin S. If he did, the couple would have a 1 in 4 (25%) chance of having  a child with hemoglobin [place] disease (HbSC disease). Individuals with HbSC disease have red blood cells that contain both hemoglobin S and hemoglobin C. These  variant forms of hemoglobin can cause red blood cells to become rigid and sickle, blocking small blood vessels and making it difficult for the red blood cells to deliver oxygen to the body's tissues. This can cause severe pain and organ damage, just as we see in individuals with sickle cell disease. Individuals with HbSC disease are at risk of the same complications as those associated with sickle cell disease, such as pain crises, acute chest syndrome, infections, asplenia, and strokes; however, these complications may occur at a lesser frequency.   Ms. Boylan's partner may be a carrier for other variants in the HBB gene (such as beta-thalassemia) that could combine with HbC trait to cause other symptoms in an affected child. Partner carrier screening of the HBB gene is recommended to determine which, if any, conditions the current fetus and the couple's future pregnancies could be at increased risk for. Based on his ethnicity, Ms. Bullen's partner has a 1 in 8 chance of being a carrier for an HBB-related hemoglobinopathy. Thus, the chance for the current fetus to be affected with a hemoglobinopathy is currently 1 in 32 (~3%).  Alpha-thalassemia:  Ms. Lashley was also identified as a silent carrier for alpha-thalassemia (aa/a-) on Horizon-4 carrier screening. Alpha-thalassemia is different in its inheritance compared to other hemoglobinopathies as there are two copies of two alpha globin genes (HBA1 and HBA2) on each chromosome 16, or four alpha globin genes total (aa/aa). A person can be a carrier of one alpha gene mutation (aa/a-), also referred to as a "silent carrier". A person who carries two alpha globin gene mutations can either carry them in cis (both on the same chromosome, denoted as aa/--) or in trans (on different chromosomes, denoted as a-/a-). Alpha-thalassemia carriers of two mutations who have African American ancestry are more likely to have a trans arrangement (a-/a-); cis configuration is  reported to be rare in individuals with African American ancestry.     There are several different forms of alpha-thalassemia. The most severe form of alpha-thalassemia, Hb Barts, is associated with an absence of alpha globin chain synthesis as a result of deletions of all four alpha globin genes (--/--).  Given that Ms. Micek is a silent carrier (aa/a-), her pregnancies would not be at increased risk for Hb Barts, even if her partner is a carrier for alpha-thalassemia, as she will always pass on at least one copy of the alpha globin gene to her children. Hemoglobin H (HbH) disease is caused by three deleted or dysfunctioning alpha globin alleles (a-/--) and is characterized by microcytic hypochromic hemolytic anemia, hepatosplenomegaly, mild jaundice, growth retardation, and sometimes thalassemia-like bone changes. Given Ms. Nalepa's silent carrier status (aa/a-), the current fetus would only be at risk for HbH disease (a-/--), if her partner is a carrier for two alpha globin mutations in cis (aa/--). If this is the case, the risk for HbH disease in the pregnancy would be 1 in 4 (25%). However, if Ms. Sher's partner is a carrier for two alpha globin mutations, he would be more likely to carry them in trans configuration (a-/a-) than the cis configuration (aa/--), given his ethnicity. If he is a carrier of alpha-thalassemia in trans, then the pregnancy would not be at increased risk for HbH disease. Based on the carrier frequency for alpha-thalassemia in the African American population, Ms. Dini's partner has a  1 in 30 chance of being any type of carrier for alpha-thalassemia. Thus, the couple has a <1% chance of having a child with HbH disease.  Other carrier screening results:  Ms. Kruckenberg carrier screening was negative for the other 2 conditions screened. Thus, her risk to be a carrier for these additional conditions (listed separately in the laboratory report) has been reduced but not  eliminated. This also significantly reduces her risk of having a child affected by one of these conditions. We discussed that carrier testing for alpha-thalassemia and HBB-related hemoglobinopathies is recommended for Ms. Saefong's partner. The couple indicated that they are interested in pursuing partner carrier screening. The couple was informed that all babies born in [place] are screened for hemoglobinopathies as a part of [place]'s newborn screening. However, alpha-thalassemia is not included on newborn screening.  Aneuploidy screening results:  We also reviewed that Ms. Snowball had [facility] noninvasive prenatal screening (NIPS) through the laboratory [facility] that was low-risk for fetal aneuploidies. We reviewed that these results showed a less than 1 in 10,000 risk for trisomies 21, 18 and 13, and monosomy X (Turner syndrome).  In addition, the risk for triploidy and sex chromosome trisomies (47,XXX and 47,XXY) was also low. Ms. Dils elected to have cfDNA analysis for 22q11.2 deletion syndrome, which was also low risk (1 in 3100). We reviewed that while this testing identifies 94-99% of pregnancies with trisomy 51, trisomy 55, trisomy 34, and >70% of cases of sex chromosome aneuploidies, and triploidy, it is NOT diagnostic. A positive test result requires confirmation by CVS or amniocentesis, and a negative test result does not rule out a fetal chromosome abnormality. She also understands that this testing does not identify all genetic conditions.  Ms. Dost did not want to learn about the expected fetal sex as she will be having a gender reveal party. However, Ms. Mackley's partner elected to learn about the expected fetal sex based on these NIPS results, which I disclosed to him.  Diagnostic testing:  Ms. Logiudice was also counseled regarding the option of diagnostic testing via chorionic villus sampling (CVS) or amniocentesis . We discussed the technical aspects of each procedure  and quoted up to a 1 in 500 (0.2%) risk for spontaneous pregnancy loss or other adverse pregnancy outcomes as a result of either procedure. Cultured cells from either a placental or amniotic fluid sample allow for the visualization of a fetal karyotype, which can detect >99% of large chromosomal aberrations. Chromosomal microarray can also be performed to identify smaller deletions or duplications of fetal chromosomal material. CVS or amniocentesis could also be performed to assess whether the baby is affected by alpha-thalassemia or a hemoglobinopathy.   After careful consideration, Ms. Mcneish declined diagnostic testing at this time. She understands that diagnostic testing is available at any point through the end of pregnancy and that she may opt to undergo the procedure at a later date should she change her mind.   Plan:  Ms. Bourassa and Mr. Shon Baton expressed interest in partner carrier screening today; however, Mr. Shon Baton does not have health insurance. Mr. Shon Baton qualifies for free testing through the laboratory [facility]'s Patient Assistance Program. We filled out the application for free testing today and Ms. Mood agreed to send me her 1040 tax form via MyChart for income verification purposes. I provided Mr. Watson with a saliva kit and instructions on sample collection. Results will take 2-3 weeks to be returned from the time the laboratory receives his sample. I will call the  couple once results become available.  I counseled Ms. Thendara regarding the above risks and available options. The approximate face-to-face time with the genetic counselor was 50 minutes.  In summary:  Discussed carrier screening results and options for follow-up testing  Hemoglobin C trait  Silent carrier for alpha-thalassemia  Elected to pursue partner carrier screening. FOB qualifies for free testing through [facility]'s Patient Assistance Program. Patient will send me tax form for income verification. FOB has  saliva kit for sample collection  Reviewed low-risk NIPS result  Reduction in risk for Down syndrome, trisomy 16, trisomy 63, triploidy, sex chromosome aneuploidies, and 22q11.2 deletion syndrome  Offered additional testing and screening  Declined diagnostic testing  Reviewed family history concerns   Buelah Manis, MS, Counselling psychologist

## 2021-01-25 ENCOUNTER — Ambulatory Visit: Payer: Self-pay

## 2021-01-26 ENCOUNTER — Other Ambulatory Visit: Payer: Self-pay

## 2021-01-26 ENCOUNTER — Inpatient Hospital Stay (HOSPITAL_COMMUNITY)
Admission: AD | Admit: 2021-01-26 | Discharge: 2021-01-27 | Disposition: A | Discharge status: Home / Self Care | Payer: Medicaid Other | Attending: Obstetrics & Gynecology | Admitting: Obstetrics & Gynecology

## 2021-01-26 DIAGNOSIS — O99612 Diseases of the digestive system complicating pregnancy, second trimester: Secondary | ICD-10-CM | POA: Insufficient documentation

## 2021-01-26 DIAGNOSIS — Z3480 Encounter for supervision of other normal pregnancy, unspecified trimester: Secondary | ICD-10-CM

## 2021-01-26 DIAGNOSIS — Z20822 Contact with and (suspected) exposure to covid-19: Secondary | ICD-10-CM | POA: Insufficient documentation

## 2021-01-26 DIAGNOSIS — K219 Gastro-esophageal reflux disease without esophagitis: Secondary | ICD-10-CM | POA: Insufficient documentation

## 2021-01-26 DIAGNOSIS — J00 Acute nasopharyngitis [common cold]: Secondary | ICD-10-CM | POA: Insufficient documentation

## 2021-01-26 DIAGNOSIS — Z3A14 14 weeks gestation of pregnancy: Secondary | ICD-10-CM | POA: Insufficient documentation

## 2021-01-26 DIAGNOSIS — O2692 Pregnancy related conditions, unspecified, second trimester: Secondary | ICD-10-CM | POA: Insufficient documentation

## 2021-01-26 NOTE — MAU Note (Signed)
Pt c/o sneezing and nasal congestion , headache since Thursday. Had a fever of 101 yesterday  that went away after she took tylenol. No more fevers since. Noticed some blood streaking in her spit.

## 2021-01-27 ENCOUNTER — Encounter (HOSPITAL_COMMUNITY): Payer: Self-pay | Admitting: Obstetrics & Gynecology

## 2021-01-27 DIAGNOSIS — Z3A14 14 weeks gestation of pregnancy: Secondary | ICD-10-CM

## 2021-01-27 DIAGNOSIS — O99512 Diseases of the respiratory system complicating pregnancy, second trimester: Secondary | ICD-10-CM | POA: Diagnosis not present

## 2021-01-27 DIAGNOSIS — J00 Acute nasopharyngitis [common cold]: Secondary | ICD-10-CM | POA: Diagnosis present

## 2021-01-27 DIAGNOSIS — O99612 Diseases of the digestive system complicating pregnancy, second trimester: Secondary | ICD-10-CM

## 2021-01-27 DIAGNOSIS — O2692 Pregnancy related conditions, unspecified, second trimester: Secondary | ICD-10-CM | POA: Diagnosis not present

## 2021-01-27 DIAGNOSIS — Z20822 Contact with and (suspected) exposure to covid-19: Secondary | ICD-10-CM | POA: Diagnosis not present

## 2021-01-27 DIAGNOSIS — K219 Gastro-esophageal reflux disease without esophagitis: Secondary | ICD-10-CM

## 2021-01-27 LAB — URINALYSIS, ROUTINE W REFLEX MICROSCOPIC
Bacteria, UA: NONE SEEN
Bilirubin Urine: NEGATIVE
Glucose, UA: NEGATIVE mg/dL
Hgb urine dipstick: NEGATIVE
Ketones, ur: NEGATIVE mg/dL
Nitrite: NEGATIVE
Protein, ur: NEGATIVE mg/dL
Specific Gravity, Urine: 1.014 (ref 1.005–1.030)
pH: 6 (ref 5.0–8.0)

## 2021-01-27 LAB — RESP PANEL BY RT-PCR (FLU A&B, COVID) ARPGX2
Influenza A by PCR: NEGATIVE
Influenza B by PCR: NEGATIVE
SARS Coronavirus 2 by RT PCR: NEGATIVE

## 2021-01-27 MED ORDER — FAMOTIDINE 20 MG PO TABS
20.0000 mg | ORAL_TABLET | Freq: Once | ORAL | Status: AC
  Administered 2021-01-27: 20 mg via ORAL
  Filled 2021-01-27: qty 1

## 2021-01-27 MED ORDER — FAMOTIDINE 20 MG PO TABS: 20.0000 mg | ORAL_TABLET | Freq: Two times a day (BID) | ORAL | 6 refills | Status: DC

## 2021-01-27 MED ORDER — PSEUDOEPHEDRINE HCL 30 MG PO TABS
60.0000 mg | ORAL_TABLET | Freq: Once | ORAL | Status: AC
  Administered 2021-01-27: 60 mg via ORAL
  Filled 2021-01-27: qty 2

## 2021-01-27 MED ORDER — PSEUDOEPHEDRINE HCL 60 MG PO TABS: 60.0000 mg | ORAL_TABLET | Freq: Four times a day (QID) | ORAL | 1 refills | Status: DC | PRN

## 2021-01-27 NOTE — Discharge Instructions (Signed)
Upper Respiratory Infection, Adult An upper respiratory infection (URI) is a common viral infection of the nose, throat, and upper air passages that lead to the lungs. The most common type of URI is the common cold. URIs usually get better on their own, without medical treatment. What are the causes? A URI is caused by a virus. You may catch a virus by:  Breathing in droplets from an infected person's cough or sneeze.  Touching something that has been exposed to the virus (contaminated) and then touching your mouth, nose, or eyes. What increases the risk? You are more likely to get a URI if:  You are very young or very old.  It is autumn or winter.  You have close contact with others, such as at a daycare, school, or health care facility.  You smoke.  You have long-term (chronic) heart or lung disease.  You have a weakened disease-fighting (immune) system.  You have nasal allergies or asthma.  You are experiencing a lot of stress.  You work in an area that has poor air circulation.  You have poor nutrition. What are the signs or symptoms? A URI usually involves some of the following symptoms:  Runny or stuffy (congested) nose.  Sneezing.  Cough.  Sore throat.  Headache.  Fatigue.  Fever.  Loss of appetite.  Pain in your forehead, behind your eyes, and over your cheekbones (sinus pain).  Muscle aches.  Redness or irritation of the eyes.  Pressure in the ears or face. How is this diagnosed? This condition may be diagnosed based on your medical history and symptoms, and a physical exam. Your health care provider may use a cotton swab to take a mucus sample from your nose (nasal swab). This sample can be tested to determine what virus is causing the illness. How is this treated? URIs usually get better on their own within 7-10 days. You can take steps at home to relieve your symptoms. Medicines cannot cure URIs, but your health care provider may recommend  certain medicines to help relieve symptoms, such as:  Over-the-counter cold medicines.  Cough suppressants. Coughing is a type of defense against infection that helps to clear the respiratory system, so take these medicines only as recommended by your health care provider.  Fever-reducing medicines. Follow these instructions at home: Activity  Rest as needed.  If you have a fever, stay home from work or school until your fever is gone or until your health care provider says you are no longer contagious. Your health care provider may have you wear a face mask to prevent your infection from spreading. Relieving symptoms  Gargle with a salt-water mixture 3-4 times a day or as needed. To make a salt-water mixture, completely dissolve -1 tsp of salt in 1 cup of warm water.  Use a cool-mist humidifier to add moisture to the air. This can help you breathe more easily. Eating and drinking  Drink enough fluid to keep your urine pale yellow.  Eat soups and other clear broths.   General instructions  Take over-the-counter and prescription medicines only as told by your health care provider. These include cold medicines, fever reducers, and cough suppressants.  Do not use any products that contain nicotine or tobacco, such as cigarettes and e-cigarettes. If you need help quitting, ask your health care provider.  Stay away from secondhand smoke.  Stay up to date on all immunizations, including the yearly (annual) flu vaccine.  Keep all follow-up visits as told by your health  care provider. This is important.   How to prevent the spread of infection to others  URIs can be passed from person to person (are contagious). To prevent the infection from spreading: ? Wash your hands often with soap and water. If soap and water are not available, use hand sanitizer. ? Avoid touching your mouth, face, eyes, or nose. ? Cough or sneeze into a tissue or your sleeve or elbow instead of into your hand or  into the air.   Contact a health care provider if:  You are getting worse instead of better.  You have a fever or chills.  Your mucus is brown or red.  You have yellow or brown discharge coming from your nose.  You have pain in your face, especially when you bend forward.  You have swollen neck glands.  You have pain while swallowing.  You have white areas in the back of your throat. Get help right away if:  You have shortness of breath that gets worse.  You have severe or persistent: ? Headache. ? Ear pain. ? Sinus pain. ? Chest pain.  You have chronic lung disease along with any of the following: ? Wheezing. ? Prolonged cough. ? Coughing up blood. ? A change in your usual mucus.  You have a stiff neck.  You have changes in your: ? Vision. ? Hearing. ? Thinking. ? Mood. Summary  An upper respiratory infection (URI) is a common infection of the nose, throat, and upper air passages that lead to the lungs.  A URI is caused by a virus.  URIs usually get better on their own within 7-10 days.  Medicines cannot cure URIs, but your health care provider may recommend certain medicines to help relieve symptoms. This information is not intended to replace advice given to you by your health care provider. Make sure you discuss any questions you have with your health care provider. Document Revised: 05/17/2020 Document Reviewed: 05/17/2020 Elsevier Patient Education  2021 [employer].  Safe Medications in Pregnancy   Acne: Benzoyl Peroxide Salicylic Acid  Backache/Headache: Tylenol: 2 regular strength every 4 hours OR              2 Extra strength every 6 hours  Colds/Coughs/Allergies: Benadryl (alcohol free) 25 mg every 6 hours as needed Breath right strips Claritin Cepacol throat lozenges Chloraseptic throat spray Cold-Eeze- up to three times per day Cough drops, alcohol free Flonase (by prescription only) Guaifenesin Mucinex Robitussin DM (plain  only, alcohol free) Saline nasal spray/drops Sudafed (pseudoephedrine) & Actifed ** use only after [redacted] weeks gestation and if you do not have high blood pressure Tylenol Vicks Vaporub Zinc lozenges Zyrtec   Constipation: Colace Ducolax suppositories Fleet enema Glycerin suppositories Metamucil Milk of magnesia Miralax Senokot Smooth move tea  Diarrhea: Kaopectate Imodium A-D  *NO pepto Bismol  Hemorrhoids: Anusol Anusol HC Preparation H Tucks  Indigestion: Tums Maalox Mylanta Zantac  Pepcid  Insomnia: Benadryl (alcohol free) 25mg  every 6 hours as needed Tylenol PM Unisom, no Gelcaps  Leg Cramps: Tums MagGel  Nausea/Vomiting:  Bonine Dramamine Emetrol Ginger extract Sea bands Meclizine  Nausea medication to take during pregnancy:  Unisom (doxylamine succinate 25 mg tablets) Take one tablet daily at bedtime. If symptoms are not adequately controlled, the dose can be increased to a maximum recommended dose of two tablets daily (1/2 tablet in the morning, 1/2 tablet mid-afternoon and one at bedtime). Vitamin B6 100mg  tablets. Take one tablet twice a day (up to 200 mg per  day).  Skin Rashes: Aveeno products Benadryl cream or 25mg  every 6 hours as needed Calamine Lotion 1% cortisone cream  Yeast infection: Gyne-lotrimin 7 Monistat 7   **If taking multiple medications, please check labels to avoid duplicating the same active ingredients **take medication as directed on the label ** Do not exceed 4000 mg of tylenol in 24 hours **Do not take medications that contain aspirin or ibuprofen

## 2021-01-27 NOTE — MAU Provider Note (Signed)
Chief Complaint: Nasal Congestion and Sinusitis   Event Date/Time   First Provider Initiated Contact with Patient 01/27/21 0102     SUBJECTIVE HPI: Sarah Copeland is a 25 y.o. G3P2002 at [redacted]w[redacted]d who presents to Maternity Admissions reporting fever, sneezing and nasal congestion , 5/10 headache since 01/24/21. Had a fever of 101 on 01/25/21 that went away after she took Tylenol and has not returned. Was exposed to a coworker w/ similar Sx 4 days ago. Does not know if she had Covid. Had Moderna vaccine x 2.   Also C/O reflux through pregnancy. Hasn't tried anything besides sitting up after eating and that doesn't help.   Modifying factors: Hasn't tries anything besides Tylenol.  Associated signs and symptoms: Neg for cough, SOB, chest pain, loss of taste or smell, GI complaints.   Past Medical History:  Diagnosis Date  . Anemia   . Medical history non-contributory   . Preeclampsia 2018   OB History  Gravida Para Term Preterm AB Living  3 2 2     2   SAB IAB Ectopic Multiple Live Births          2    # Outcome Date GA Lbr Len/2nd Weight Sex Delivery Anes PTL Lv  3 Current           2 Term 01/19/19    M CS-Unspec   LIV  1 Term 12/17/16    F CS-Unspec   LIV   Past Surgical History:  Procedure Laterality Date  . CESAREAN SECTION     Social History   Socioeconomic History  . Marital status: Single    Spouse name: Not on file  . Number of children: Not on file  . Years of education: Not on file  . Highest education level: Not on file  Occupational History  . Not on file  Tobacco Use  . Smoking status: Never Smoker  . Smokeless tobacco: Never Used  Substance and Sexual Activity  . Alcohol use: Never  . Drug use: Never  . Sexual activity: Yes    Birth control/protection: None  Other Topics Concern  . Not on file  Social History Narrative  . Not on file   Social Determinants of Health   Financial Resource Strain: Not on file  Food Insecurity: Not on file  Transportation  Needs: Not on file  Physical Activity: Not on file  Stress: Not on file  Social Connections: Not on file  Intimate Partner Violence: Not on file   No family history on file. No current facility-administered medications on file prior to encounter.   Current Outpatient Medications on File Prior to Encounter  Medication Sig Dispense Refill  . aspirin 81 MG chewable tablet Chew 1 tablet (81 mg total) by mouth daily. 30 tablet 5  . lidocaine (XYLOCAINE) 2 % solution Use as directed 10 mLs in the mouth or throat as needed for mouth pain. (Patient not taking: Reported on 12/31/2020) 100 mL 0  . prenatal vitamin w/FE, FA (PRENATAL 1 + 1) 27-1 MG TABS tablet Take 1 tablet by mouth daily at 12 noon. 30 tablet 11  . terconazole (TERAZOL 7) 0.4 % vaginal cream Place 1 applicator vaginally at bedtime. 45 g 0  . tinidazole (TINDAMAX) 500 MG tablet Take 4 tablets (2,000 mg total) by mouth daily with breakfast. 8 tablet 0   Allergies  Allergen Reactions  . Amoxicillin Anaphylaxis    I have reviewed patient's Past Medical Hx, Surgical Hx, Family Hx, Social Hx, medications and  allergies.   Review of Systems  Constitutional: Positive for fever. Negative for appetite change and chills.  HENT: Positive for congestion, rhinorrhea, sinus pain and sneezing. Negative for ear pain and sore throat.   Eyes: Negative for photophobia and visual disturbance.  Respiratory: Negative for cough, shortness of breath and wheezing.   Cardiovascular: Negative for chest pain.  Gastrointestinal: Negative for abdominal pain, diarrhea, nausea and vomiting.  Genitourinary: Negative for vaginal bleeding.  Musculoskeletal: Negative for myalgias.  Neurological: Positive for headaches.    OBJECTIVE Patient Vitals for the past 24 hrs:  BP Temp Pulse Resp SpO2  01/26/21 2303 120/72 98.5 F (36.9 C) 96 18 98 %   Constitutional: Well-developed, well-nourished female in no acute distress. Mildly ill-appearing.  Cardiovascular:  normal rate and rhythm. No M/R/G Head: Rhinorrhea and congestion present. Throat clear  Respiratory: normal rate and effort. CTAB. No wheezing or stridor.   GI: Abd soft, non-tender, gravid appropriate for gestational age.  MS: Deferred Neurologic: Alert and oriented x 4. Nml speech and gait GU: Deferred  FHR 157 by doppler  LAB RESULTS Results for orders placed or performed during the hospital encounter of 01/26/21 (from the past 24 hour(s))  Resp Panel by RT-PCR (Flu A&B, Covid) Nasopharyngeal Swab     Status: None   Collection Time: 01/27/21 12:12 AM   Specimen: Nasopharyngeal Swab; Nasopharyngeal(NP) swabs in vial transport medium  Result Value Ref Range   SARS Coronavirus 2 by RT PCR NEGATIVE NEGATIVE   Influenza A by PCR NEGATIVE NEGATIVE   Influenza B by PCR NEGATIVE NEGATIVE  Urinalysis, Routine w reflex microscopic Urine, Clean Catch     Status: Abnormal   Collection Time: 01/27/21 12:30 AM  Result Value Ref Range   Color, Urine YELLOW YELLOW   APPearance HAZY (A) CLEAR   Specific Gravity, Urine 1.014 1.005 - 1.030   pH 6.0 5.0 - 8.0   Glucose, UA NEGATIVE NEGATIVE mg/dL   Hgb urine dipstick NEGATIVE NEGATIVE   Bilirubin Urine NEGATIVE NEGATIVE   Ketones, ur NEGATIVE NEGATIVE mg/dL   Protein, ur NEGATIVE NEGATIVE mg/dL   Nitrite NEGATIVE NEGATIVE   Leukocytes,Ua MODERATE (A) NEGATIVE   RBC / HPF 0-5 0 - 5 RBC/hpf   WBC, UA 0-5 0 - 5 WBC/hpf   Bacteria, UA NONE SEEN NONE SEEN   Squamous Epithelial / LPF 21-50 0 - 5   Mucus PRESENT     IMAGING No results found.  MAU COURSE Orders Placed This Encounter  Procedures  . Resp Panel by RT-PCR (Flu A&B, Covid) Nasopharyngeal Swab  . Urinalysis, Routine w reflex microscopic Urine, Clean Catch  . Airborne and Contact precautions  . Discharge patient   Meds ordered this encounter  Medications  . pseudoephedrine (SUDAFED) tablet 60 mg  . famotidine (PEPCID) tablet 20 mg  . famotidine (PEPCID) 20 MG tablet    Sig:  Take 1 tablet (20 mg total) by mouth 2 (two) times daily.    Dispense:  60 tablet    Refill:  6    Order Specific Question:   Supervising Provider    Answer:   Despina Hidden, LUTHER H [[id number]]  . pseudoephedrine (SUDAFED) 60 MG tablet    Sig: Take 1 tablet (60 mg total) by mouth every 6 (six) hours as needed for congestion.    Dispense:  30 tablet    Refill:  1    Order Specific Question:   Supervising Provider    Answer:   Duane Lope H [[id number]]  MDM - URI Sx w/ neg Covid and Flu. No know Covid contacts. Pt is vaccinated and only mildly ill. Suspect non-Covid viral URI. Stab;e for OP management. Discussed meds safe for pregnancy. Return to MAU or ED for SOB, uncontrolled fever, significant cough.   ASSESSMENT 1. Acute nasopharyngitis   2. Supervision of other normal pregnancy, antepartum   3. Gastroesophageal reflux during pregnancy in second trimester, antepartum     PLAN Discharge home in stable condition. URI/Covid precautions  Follow-up Information    Center for [facility] Follow up.   Specialty: Obstetrics and Gynecology Why: As scheduled Contact information: [address] [place] [address] [phone]       [facility] Follow up.   Specialty: Obstetrics and Gynecology Why: As needed for pregnancy emergencies or if you develop shortness of breath, persistent fever a bad cough. Contact information: [address] [id number] mc [place] [address] [phone]             Allergies as of 01/27/2021      Reactions   Amoxicillin Anaphylaxis      Medication List    STOP taking these medications   miconazole 2 % cream Commonly known as: MICOTIN     TAKE these medications   aspirin 81 MG chewable tablet Chew 1 tablet (81 mg total) by mouth daily.   famotidine 20 MG tablet Commonly known as: PEPCID Take 1 tablet (20 mg total) by mouth 2 (two) times daily.    lidocaine 2 % solution Commonly known as: XYLOCAINE Use as directed 10 mLs in the mouth or throat as needed for mouth pain.   prenatal vitamin w/FE, FA 27-1 MG Tabs tablet Take 1 tablet by mouth daily at 12 noon.   pseudoephedrine 60 MG tablet Commonly known as: SUDAFED Take 1 tablet (60 mg total) by mouth every 6 (six) hours as needed for congestion.   terconazole 0.4 % vaginal cream Commonly known as: TERAZOL 7 Place 1 applicator vaginally at bedtime.   tinidazole 500 MG tablet Commonly known as: TINDAMAX Take 4 tablets (2,000 mg total) by mouth daily with breakfast.        Katrinka Blazing, [place], CNM 01/27/2021  2:43 AM

## 2021-01-28 ENCOUNTER — Other Ambulatory Visit: Payer: Self-pay

## 2021-01-28 ENCOUNTER — Ambulatory Visit (INDEPENDENT_AMBULATORY_CARE_PROVIDER_SITE_OTHER): Payer: Medicaid Other | Admitting: Nurse Practitioner

## 2021-01-28 VITALS — BP 116/82 | HR 101 | Wt 221.0 lb

## 2021-01-28 DIAGNOSIS — R12 Heartburn: Secondary | ICD-10-CM

## 2021-01-28 DIAGNOSIS — Z98891 History of uterine scar from previous surgery: Secondary | ICD-10-CM

## 2021-01-28 DIAGNOSIS — Z3480 Encounter for supervision of other normal pregnancy, unspecified trimester: Secondary | ICD-10-CM

## 2021-01-28 DIAGNOSIS — Z3A14 14 weeks gestation of pregnancy: Secondary | ICD-10-CM

## 2021-01-28 MED ORDER — BLOOD PRESSURE KIT DEVI: 1.0000 | 0 refills | Status: DC

## 2021-01-28 NOTE — Progress Notes (Signed)
MFM [place] scheduled on 6/10 at 1245pm. Pt aware and agreeable to date and time of appt.

## 2021-01-28 NOTE — Progress Notes (Signed)
    Subjective:  Sarah Copeland is a 25 y.o. G3P2002 at [redacted]w[redacted]d being seen today for ongoing prenatal care.  She is currently monitored for the following issues for this low-risk pregnancy and has Supervision of other normal pregnancy, antepartum; Vaginal discharge during pregnancy in first trimester; History of 2 cesarean sections; and Hx of preeclampsia, prior pregnancy, currently pregnant on their problem list.  Patient reports no complaints.  Contractions: Irritability. Vag. Bleeding: None.  Movement: Present. Denies leaking of fluid.   The following portions of the patient's history were reviewed and updated as appropriate: allergies, current medications, past family history, past medical history, past social history, past surgical history and problem list. Problem list updated.  Objective:   Vitals:   01/28/21 1447  BP: 116/82  Pulse: (!) 101  Weight: 221 lb (100.2 kg)    Fetal Status: Fetal Heart Rate (bpm): 154 Fundal Height: 21 cm Movement: Present     General:  Alert, oriented and cooperative. Patient is in no acute distress.  Skin: Skin is warm and dry. No rash noted.   Cardiovascular: Normal heart rate noted  Respiratory: Normal respiratory effort, no problems with respiration noted  Abdomen: Soft, gravid, appropriate for gestational age. Pain/Pressure: Absent     Pelvic:  Cervical exam deferred        Extremities: Normal range of motion.  Edema: None  Mental Status: Normal mood and affect. Normal behavior. Normal judgment and thought content.   Urinalysis:      Assessment and Plan:  Pregnancy: G3P2002 at [redacted]w[redacted]d  1. Supervision of other normal pregnancy, antepartum To pick up BP cuff tomorrow. [place] scheduled Doing well - no questions.  - Blood Pressure Monitoring (BLOOD PRESSURE KIT) DEVI; 1 Device by Does not apply route once a week.  Dispense: 1 each; Refill: 0 - [place] MFM OB COMP + 14 WK; Future  2. History of 2 cesarean sections Will see MD for delivery plan later  in the pregnancy Wants one more child after this pregnancy  3. Heartburn On medication now, started yesterday    Preterm labor symptoms and general obstetric precautions including but not limited to vaginal bleeding, contractions, leaking of fluid and fetal movement were reviewed in detail with the patient. Please refer to After Visit Summary for other counseling recommendations.  Return in about 4 weeks (around 02/25/2021) for in person ROB.  Earlie Server, RN, MSN, NP-BC Nurse Practitioner, [facility] for [employer], [place] 01/28/2021 3:22 PM

## 2021-01-30 ENCOUNTER — Telehealth: Payer: Self-pay | Admitting: Genetic Counselor

## 2021-01-30 DIAGNOSIS — Z3480 Encounter for supervision of other normal pregnancy, unspecified trimester: Secondary | ICD-10-CM | POA: Diagnosis not present

## 2021-01-30 NOTE — Telephone Encounter (Signed)
I returned Ms. Vanvleck's call regarding a problem with her partner's carrier screening saliva collection kit. Unfortunately, Ms. Luppino informed me that Torreace is sick, so when he collected his saliva sample this morning it looked more like yellow mucus than saliva. They poured out the sample and washed out the tube. However, in the process, the liquid that keeps the saliva sample stable in the tube began leaking. I will contact [facility] and have the laboratory mail the couple a new saliva kit. Torreace will provide a new sample once he is feeling better.  Ms. Silvester also told me that she would send me a copy of her tax form this afternoon after running some errands to ensure that Torreace's testing will be free. She confirmed that she had no further questions at this time.  Buelah Manis, MS, [facility] Genetic Counselor

## 2021-02-05 ENCOUNTER — Encounter (HOSPITAL_COMMUNITY): Payer: Self-pay | Admitting: Obstetrics & Gynecology

## 2021-02-05 ENCOUNTER — Inpatient Hospital Stay (HOSPITAL_COMMUNITY)
Admission: AD | Admit: 2021-02-05 | Discharge: 2021-02-05 | Disposition: A | Discharge status: Home / Self Care | Payer: Medicaid Other | Attending: Obstetrics & Gynecology | Admitting: Obstetrics & Gynecology

## 2021-02-05 ENCOUNTER — Other Ambulatory Visit: Payer: Self-pay

## 2021-02-05 DIAGNOSIS — Z880 Allergy status to penicillin: Secondary | ICD-10-CM | POA: Diagnosis not present

## 2021-02-05 DIAGNOSIS — Z3492 Encounter for supervision of normal pregnancy, unspecified, second trimester: Secondary | ICD-10-CM

## 2021-02-05 DIAGNOSIS — B9689 Other specified bacterial agents as the cause of diseases classified elsewhere: Secondary | ICD-10-CM | POA: Diagnosis not present

## 2021-02-05 DIAGNOSIS — O26892 Other specified pregnancy related conditions, second trimester: Secondary | ICD-10-CM | POA: Diagnosis not present

## 2021-02-05 DIAGNOSIS — M549 Dorsalgia, unspecified: Secondary | ICD-10-CM | POA: Insufficient documentation

## 2021-02-05 DIAGNOSIS — R102 Pelvic and perineal pain: Secondary | ICD-10-CM | POA: Insufficient documentation

## 2021-02-05 DIAGNOSIS — O23592 Infection of other part of genital tract in pregnancy, second trimester: Secondary | ICD-10-CM | POA: Insufficient documentation

## 2021-02-05 DIAGNOSIS — N760 Acute vaginitis: Secondary | ICD-10-CM | POA: Diagnosis not present

## 2021-02-05 DIAGNOSIS — Z3A15 15 weeks gestation of pregnancy: Secondary | ICD-10-CM

## 2021-02-05 LAB — URINALYSIS, ROUTINE W REFLEX MICROSCOPIC
Bilirubin Urine: NEGATIVE
Glucose, UA: NEGATIVE mg/dL
Hgb urine dipstick: NEGATIVE
Ketones, ur: 5 mg/dL — AB
Leukocytes,Ua: NEGATIVE
Nitrite: NEGATIVE
Protein, ur: 30 mg/dL — AB
Specific Gravity, Urine: 1.034 — ABNORMAL HIGH (ref 1.005–1.030)
pH: 5 (ref 5.0–8.0)

## 2021-02-05 LAB — GC/CHLAMYDIA PROBE AMP (~~LOC~~) NOT AT ARMC
Chlamydia: NEGATIVE
Comment: NEGATIVE
Comment: NORMAL
Neisseria Gonorrhea: NEGATIVE

## 2021-02-05 LAB — WET PREP, GENITAL
Sperm: NONE SEEN
Trich, Wet Prep: NONE SEEN
Yeast Wet Prep HPF POC: NONE SEEN

## 2021-02-05 MED ORDER — METRONIDAZOLE 0.75 % VA GEL: 1.0000 | Freq: Two times a day (BID) | VAGINAL | 0 refills | Status: DC

## 2021-02-05 NOTE — Discharge Instructions (Signed)
Bacterial Vaginosis  Bacterial vaginosis is an infection of the vagina. It happens when too many normal germs (healthy bacteria) grow in the vagina. This infection can make it easier to get other infections from sex (STIs). It is very important for pregnant women to get treated. This infection can cause babies to be born early or at a low birth weight. What are the causes? This infection is caused by an increase in certain germs that grow in the vagina. You cannot get this infection from toilet seats, bedsheets, swimming pools, or things that touch your vagina. What increases the risk?  Having sex with a new person or more than one person.  Having sex without protection.  Douching.  Having an intrauterine device (IUD).  Smoking.  Using drugs or drinking alcohol. These can lead you to do things that are risky.  Taking certain antibiotic medicines.  Being pregnant. What are the signs or symptoms? Some women have no symptoms. Symptoms may include:  A discharge from your vagina. It may be gray or white. It can be watery or foamy.  A fishy smell. This can happen after sex or during your menstrual period.  Itching in and around your vagina.  A feeling of burning or pain when you pee (urinate). How is this treated? This infection is treated with antibiotic medicines. These may be given to you as:  A pill.  A cream for your vagina.  A medicine that you put into your vagina (suppository). If the infection comes back after treatment, you may need more antibiotics. Follow these instructions at home: Medicines  Take over-the-counter and prescription medicines as told by your doctor.  Take or use your antibiotic medicine as told by your doctor. Do not stop taking or using it, even if you start to feel better. General instructions  If the person you have sex with is a woman, tell her that you have this infection. She will need to follow up with her doctor. If you have a female  partner, he does not need to be treated.  Do not have sex until you finish treatment.  Drink enough fluid to keep your pee pale yellow.  Keep your vagina and butt clean. ? Wash the area with warm water each day. ? Wipe from front to back after you use the toilet.  If you are breastfeeding a baby, ask your doctor if you should keep doing so during treatment.  Keep all follow-up visits. How is this prevented? Self-care  Do not douche.  Use only warm water to wash around your vagina.  Wear underwear that is cotton or lined with cotton.  Do not wear tight pants and pantyhose, especially in the summer. Safe sex  Use protection when you have sex. This includes: ? Use condoms. ? Use dental dams. This is a thin layer that protects the mouth during oral sex.  Limit how many people you have sex with. To prevent this infection, it is best to have sex with just one person.  Get tested for STIs. The person you have sex with should also get tested. Drugs and alcohol  Do not smoke or use any products that contain nicotine or tobacco. If you need help quitting, ask your doctor.  Do not use drugs.  Do not drink alcohol if: ? Your doctor tells you not to drink. ? You are pregnant, may be pregnant, or are planning to become pregnant.  If you drink alcohol: ? Limit how much you have to 0-1 drink   a day. ? Know how much alcohol is in your drink. In the [place] one drink equals one 12 oz bottle of beer (355 mL), one 5 oz glass of wine (148 mL), or one 1 oz glass of hard liquor (44 mL). Where to find more information  Centers for Disease Control and Prevention: www.cdc.gov  American Sexual Health Association: www.ashastd.org  Office on Women's Health: www.womenshealth.gov Contact a doctor if:  Your symptoms do not get better, even after you are treated.  You have more discharge or pain when you pee.  You have a fever or chills.  You have pain in your belly (abdomen) or in the area  between your hips.  You have pain with sex.  You bleed from your vagina between menstrual periods. Summary  This infection can happen when too many germs (bacteria) grow in the vagina.  This infection can make it easier to get infections from sex (STIs). Treating this can lower that chance.  Get treated if you are pregnant. This infection can cause babies to be born early.  Do not stop taking or using your antibiotic medicine, even if you start to feel better. This information is not intended to replace advice given to you by your health care provider. Make sure you discuss any questions you have with your health care provider. Document Revised: 03/08/2020 Document Reviewed: 03/08/2020 Elsevier Patient Education  2021 Elsevier Inc.  

## 2021-02-05 NOTE — MAU Provider Note (Signed)
Event Date/Time   First Provider Initiated Contact with Patient 02/05/21 0054     S [name] is a 25 y.o. G90P2002 pregnant female at 46w4dwho presents to MAU today with complaint of back pain especially when standing (stands all day at work) as well as pelvic pressure and cramping. Was being treated for BV but never finished the pills because they tasted bad and took away her appetite. Last IC was last week, had some vaginal discomfort afterward but has not noticed increased discharge. Denies any vaginal bleeding or LOF, starting to feel occasional fetal movement. No other physical complaints.  [facility]  Pertinent items noted in HPI and remainder of comprehensive ROS otherwise negative.   O BP 122/81   Pulse 98   Temp 98.5 F (36.9 C)   Resp 18   Ht _0  (1.702 m)   LMP 10/20/2020 (Approximate)   BMI 34.61 kg/m  Physical Exam Vitals and nursing note reviewed.  Constitutional:      General: She is not in acute distress.    Appearance: She is well-developed. She is not ill-appearing.  HENT:     Head: Normocephalic and atraumatic.     Mouth/Throat:     Mouth: Mucous membranes are moist.  Eyes:     Pupils: Pupils are equal, round, and reactive to light.  Cardiovascular:     Rate and Rhythm: Normal rate.  Pulmonary:     Effort: Pulmonary effort is normal.  Abdominal:     General: There is no distension.     Palpations: Abdomen is soft.     Tenderness: There is no abdominal tenderness.  Genitourinary:    Vagina: Vaginal discharge (no blood, blind swabs obtained) present.  Musculoskeletal:        General: Normal range of motion.     Cervical back: Normal range of motion.  Skin:    General: Skin is warm.     Capillary Refill: Capillary refill takes less than 2 seconds.  Neurological:     Mental Status: She is alert and oriented to person, place, and time. Mental status is at baseline.  Psychiatric:        Mood and Affect: Mood normal.         Behavior: Behavior normal.        Thought Content: Thought content normal.        Judgment: Judgment normal.    FHR: 155  Results for orders placed or performed during the hospital encounter of 02/05/21 (from the past 24 hour(s))  Urinalysis, Routine w reflex microscopic Urine, Clean Catch     Status: Abnormal   Collection Time: 02/05/21 12:42 AM  Result Value Ref Range   Color, Urine YELLOW YELLOW   APPearance HAZY (A) CLEAR   Specific Gravity, Urine 1.034 (H) 1.005 - 1.030   pH 5.0 5.0 - 8.0   Glucose, UA NEGATIVE NEGATIVE mg/dL   Hgb urine dipstick NEGATIVE NEGATIVE   Bilirubin Urine NEGATIVE NEGATIVE   Ketones, ur 5 (A) NEGATIVE mg/dL   Protein, ur 30 (A) NEGATIVE mg/dL   Nitrite NEGATIVE NEGATIVE   Leukocytes,Ua NEGATIVE NEGATIVE   RBC / HPF 0-5 0 - 5 RBC/hpf   WBC, UA 6-10 0 - 5 WBC/hpf   Bacteria, UA FEW (A) NONE SEEN   Squamous Epithelial / LPF 6-10 0 - 5   Mucus PRESENT   Wet prep, genital     Status: Abnormal   Collection Time: 02/05/21  1:01 AM  Specimen: Vaginal  Result Value Ref Range   Yeast Wet Prep HPF POC NONE SEEN NONE SEEN   Trich, Wet Prep NONE SEEN NONE SEEN   Clue Cells Wet Prep HPF POC PRESENT (A) NONE SEEN   WBC, Wet Prep HPF POC MANY (A) NONE SEEN   Sperm NONE SEEN     A Bacterial vaginosis - Plan: Discharge patient  Fetal heart tones present, second trimester - Plan: Discharge patient  P Discharge from MAU in stable condition with preterm labor precautions - encouraged patient to begin using pregnancy belt at work, discussed posture changes to help ease back pain  Follow-up [place] for [employer] at [facility]. Go to.   Specialty: Obstetrics and Gynecology Why: as scheduled for ongoing prenatal care Contact information: [place] [phone]             Allergies as of 02/05/2021      Reactions   Amoxicillin Anaphylaxis      Medication List     STOP taking these medications   lidocaine 2 % solution Commonly known as: XYLOCAINE   pseudoephedrine 60 MG tablet Commonly known as: SUDAFED   terconazole 0.4 % vaginal cream Commonly known as: TERAZOL 7   tinidazole 500 MG tablet Commonly known as: TINDAMAX     TAKE these medications   aspirin 81 MG chewable tablet Chew 1 tablet (81 mg total) by mouth daily.   Blood Pressure Kit Devi 1 Device by Does not apply route once a week.   famotidine 20 MG tablet Commonly known as: PEPCID Take 1 tablet (20 mg total) by mouth 2 (two) times daily.   metroNIDAZOLE 0.75 % vaginal gel Commonly known as: METROGEL VAGINAL Place 1 Applicatorful vaginally 2 (two) times daily.   prenatal vitamin w/FE, FA 27-1 MG Tabs tablet Take 1 tablet by mouth daily at 12 noon.      Gabriel Carina, CNM 02/05/2021 2:25 AM

## 2021-02-05 NOTE — MAU Note (Signed)
Pt reports she has had back pain and abd cramping (braxton hicks) for several days. Stated she had discomfort with intercourse last week.  Has been treated for BV but has not finished abx because it taste like metal.

## 2021-02-22 ENCOUNTER — Encounter (HOSPITAL_COMMUNITY): Payer: Self-pay | Admitting: Obstetrics and Gynecology

## 2021-02-22 ENCOUNTER — Inpatient Hospital Stay (HOSPITAL_COMMUNITY)
Admission: AD | Admit: 2021-02-22 | Discharge: 2021-02-22 | Disposition: A | Discharge status: Home / Self Care | Payer: Medicaid Other | Attending: Obstetrics and Gynecology | Admitting: Obstetrics and Gynecology

## 2021-02-22 ENCOUNTER — Other Ambulatory Visit: Payer: Self-pay

## 2021-02-22 DIAGNOSIS — Z7982 Long term (current) use of aspirin: Secondary | ICD-10-CM | POA: Diagnosis not present

## 2021-02-22 DIAGNOSIS — O98812 Other maternal infectious and parasitic diseases complicating pregnancy, second trimester: Secondary | ICD-10-CM

## 2021-02-22 DIAGNOSIS — Z3A18 18 weeks gestation of pregnancy: Secondary | ICD-10-CM | POA: Insufficient documentation

## 2021-02-22 DIAGNOSIS — O99332 Smoking (tobacco) complicating pregnancy, second trimester: Secondary | ICD-10-CM | POA: Insufficient documentation

## 2021-02-22 DIAGNOSIS — B373 Candidiasis of vulva and vagina: Secondary | ICD-10-CM | POA: Insufficient documentation

## 2021-02-22 DIAGNOSIS — Z3480 Encounter for supervision of other normal pregnancy, unspecified trimester: Secondary | ICD-10-CM

## 2021-02-22 DIAGNOSIS — B3731 Acute candidiasis of vulva and vagina: Secondary | ICD-10-CM

## 2021-02-22 DIAGNOSIS — F17290 Nicotine dependence, other tobacco product, uncomplicated: Secondary | ICD-10-CM | POA: Diagnosis not present

## 2021-02-22 HISTORY — DX: Depression, unspecified: F32.A

## 2021-02-22 HISTORY — DX: Headache, unspecified: R51.9

## 2021-02-22 HISTORY — DX: Anxiety disorder, unspecified: F41.9

## 2021-02-22 HISTORY — DX: Unspecified abnormal cytological findings in specimens from vagina: R87.629

## 2021-02-22 HISTORY — DX: Calculus of kidney: N20.0

## 2021-02-22 LAB — URINALYSIS, ROUTINE W REFLEX MICROSCOPIC
Bilirubin Urine: NEGATIVE
Glucose, UA: NEGATIVE mg/dL
Hgb urine dipstick: NEGATIVE
Ketones, ur: NEGATIVE mg/dL
Nitrite: NEGATIVE
Protein, ur: NEGATIVE mg/dL
Specific Gravity, Urine: 1.029 (ref 1.005–1.030)
pH: 5 (ref 5.0–8.0)

## 2021-02-22 LAB — WET PREP, GENITAL
Clue Cells Wet Prep HPF POC: NONE SEEN
Sperm: NONE SEEN
Trich, Wet Prep: NONE SEEN

## 2021-02-22 MED ORDER — TERCONAZOLE 0.4 % VA CREA: 1.0000 | TOPICAL_CREAM | Freq: Every day | VAGINAL | 0 refills | Status: DC

## 2021-02-22 NOTE — Discharge Instructions (Signed)
Vaginal Yeast Infection, Adult  Vaginal yeast infection is a condition that causes vaginal discharge as well as soreness, swelling, and redness (inflammation) of the vagina. This is a common condition. Some women get this infection frequently. What are the causes? This condition is caused by a change in the normal balance of the yeast (candida) and bacteria that live in the vagina. This change causes an overgrowth of yeast, which causes the inflammation. What increases the risk? The condition is more likely to develop in women who:  Take antibiotic medicines.  Have diabetes.  Take birth control pills.  Are pregnant.  Douche often.  Have a weak body defense system (immune system).  Have been taking steroid medicines for a long time.  Frequently wear tight clothing. What are the signs or symptoms? Symptoms of this condition include:  White, thick, creamy vaginal discharge.  Swelling, itching, redness, and irritation of the vagina. The lips of the vagina (vulva) may be affected as well.  Pain or a burning feeling while urinating.  Pain during sex. How is this diagnosed? This condition is diagnosed based on:  Your medical history.  A physical exam.  A pelvic exam. Your health care provider will examine a sample of your vaginal discharge under a microscope. Your health care provider may send this sample for testing to confirm the diagnosis. How is this treated? This condition is treated with medicine. Medicines may be over-the-counter or prescription. You may be told to use one or more of the following:  Medicine that is taken by mouth (orally).  Medicine that is applied as a cream (topically).  Medicine that is inserted directly into the vagina (suppository). Follow these instructions at home: Lifestyle  Do not have sex until your health care provider approves. Tell your sex partner that you have a yeast infection. That person should go to his or her health care  provider and ask if they should also be treated.  Do not wear tight clothes, such as pantyhose or tight pants.  Wear breathable cotton underwear. General instructions  Take or apply over-the-counter and prescription medicines only as told by your health care provider.  Eat more yogurt. This may help to keep your yeast infection from returning.  Do not use tampons until your health care provider approves.  Try taking a sitz bath to help with discomfort. This is a warm water bath that is taken while you are sitting down. The water should only come up to your hips and should cover your buttocks. Do this 3-4 times per day or as told by your health care provider.  Do not douche.  If you have diabetes, keep your blood sugar levels under control.  Keep all follow-up visits as told by your health care provider. This is important.   Contact a health care provider if:  You have a fever.  Your symptoms go away and then return.  Your symptoms do not get better with treatment.  Your symptoms get worse.  You have new symptoms.  You develop blisters in or around your vagina.  You have blood coming from your vagina and it is not your menstrual period.  You develop pain in your abdomen. Summary  Vaginal yeast infection is a condition that causes discharge as well as soreness, swelling, and redness (inflammation) of the vagina.  This condition is treated with medicine. Medicines may be over-the-counter or prescription.  Take or apply over-the-counter and prescription medicines only as told by your health care provider.  Do not   douche. Do not have sex or use tampons until your health care provider approves.  Contact a health care provider if your symptoms do not get better with treatment or your symptoms go away and then return. This information is not intended to replace advice given to you by your health care provider. Make sure you discuss any questions you have with your health care  provider. Document Revised: 04/08/2019 Document Reviewed: 01/25/2018 Elsevier Patient Education  2021 Elsevier Inc.  

## 2021-02-22 NOTE — MAU Provider Note (Signed)
Chief Complaint:  Vaginal Discharge and Vaginal Pain   Event Date/Time   First Provider Initiated Contact with Patient 02/22/21 1736     HPI: Sarah Copeland is a 25 y.o. G3P2002 at [redacted]w[redacted]d who presents to maternity admissions reporting vaginal burning and irritation today. She reports that she has a thin white/yellow discharge, denies vaginal odor, vaginal bleeding, or abdominal pain. Patient does endorse pain during intercourse. Reports that IC is not vigorous. She reports that was recently treated for BV and completed her medications.    Pregnancy Course:   Past Medical History:  Diagnosis Date  . Anemia   . Anxiety   . Depression    stressed, going through custody battle  . Headache   . Kidney stones   . Medical history non-contributory   . Preeclampsia 2018  . Vaginal Pap smear, abnormal    f/u PP   OB History  Gravida Para Term Preterm AB Living  $Remov'3 2 2     2  'VWMujC$ SAB IAB Ectopic Multiple Live Births          2    # Outcome Date GA Lbr Len/2nd Weight Sex Delivery Anes PTL Lv  3 Current           2 Term 01/19/19    M CS-Unspec   LIV  1 Term 12/17/16    F CS-Unspec   LIV    Obstetric Comments  1- Pre E  2- wasn't dilating   Past Surgical History:  Procedure Laterality Date  . CESAREAN SECTION     Family History  Problem Relation Age of Onset  . Depression Mother   . Hypertension Mother   . Bipolar disorder Mother   . Schizophrenia Mother   . Kidney disease Father        21- kidney failure   Social History   Tobacco Use  . Smoking status: Current Every Day Smoker    Types: Cigars  . Smokeless tobacco: Never Used  . Tobacco comment: black and mild  Vaping Use  . Vaping Use: Never used  Substance Use Topics  . Alcohol use: Never  . Drug use: Never   Allergies  Allergen Reactions  . Amoxicillin Anaphylaxis   Medications Prior to Admission  Medication Sig Dispense Refill Last Dose  . aspirin 81 MG chewable tablet Chew 1 tablet (81 mg total) by mouth  daily. 30 tablet 5 02/22/2021 at Unknown time  . famotidine (PEPCID) 20 MG tablet Take 1 tablet (20 mg total) by mouth 2 (two) times daily. 60 tablet 6 02/21/2021 at Unknown time  . prenatal vitamin w/FE, FA (PRENATAL 1 + 1) 27-1 MG TABS tablet Take 1 tablet by mouth daily at 12 noon. 30 tablet 11 02/22/2021 at Unknown time  . Blood Pressure Monitoring (BLOOD PRESSURE KIT) DEVI 1 Device by Does not apply route once a week. 1 each 0   . metroNIDAZOLE (METROGEL VAGINAL) 0.75 % vaginal gel Place 1 Applicatorful vaginally 2 (two) times daily. 70 g 0     I have reviewed patient's Past Medical Hx, Surgical Hx, Family Hx, Social Hx, medications and allergies.   ROS:  Review of Systems  Constitutional: Negative.   Respiratory: Negative.   Cardiovascular: Negative.   Gastrointestinal: Negative.   Genitourinary: Positive for dyspareunia, vaginal discharge and vaginal pain.  Musculoskeletal: Negative.   Neurological: Negative.   Psychiatric/Behavioral: Negative.     Physical Exam   Patient Vitals for the past 24 hrs:  BP Temp Temp src Pulse  Resp SpO2 Height Weight  02/22/21 1636 114/71 98.1 F (36.7 C) Oral 78 17 99 % $Re'5\' 7"'Wxw$  (1.702 m) 100 kg   Constitutional: well-developed, well-nourished female in no acute distress.  Cardiovascular: normal rate Respiratory: normal effort GI: abd soft, non-tender, gravid appropriate for gestational age MS: extremities nontender, no edema, normal ROM Neurologic: alert and oriented x 4.  GU: Neg CVAT. Pelvic: NEFG, moderate amount of thick, white patches noted on vaginal walls and cervix c/w yeast, no blood, cervix without lesions/masses. No CMT    FHT: 144bpm via doppler   Labs: Results for orders placed or performed during the hospital encounter of 02/22/21 (from the past 24 hour(s))  Urinalysis, Routine w reflex microscopic Urine, Clean Catch     Status: Abnormal   Collection Time: 02/22/21  4:51 PM  Result Value Ref Range   Color, Urine AMBER (A) YELLOW    APPearance CLOUDY (A) CLEAR   Specific Gravity, Urine 1.029 1.005 - 1.030   pH 5.0 5.0 - 8.0   Glucose, UA NEGATIVE NEGATIVE mg/dL   Hgb urine dipstick NEGATIVE NEGATIVE   Bilirubin Urine NEGATIVE NEGATIVE   Ketones, ur NEGATIVE NEGATIVE mg/dL   Protein, ur NEGATIVE NEGATIVE mg/dL   Nitrite NEGATIVE NEGATIVE   Leukocytes,Ua LARGE (A) NEGATIVE   RBC / HPF 0-5 0 - 5 RBC/hpf   WBC, UA 21-50 0 - 5 WBC/hpf   Bacteria, UA RARE (A) NONE SEEN   Squamous Epithelial / LPF 6-10 0 - 5   Mucus PRESENT    Budding Yeast PRESENT   Wet prep, genital     Status: Abnormal   Collection Time: 02/22/21  6:10 PM   Specimen: Vaginal  Result Value Ref Range   Yeast Wet Prep HPF POC PRESENT (A) NONE SEEN   Trich, Wet Prep NONE SEEN NONE SEEN   Clue Cells Wet Prep HPF POC NONE SEEN NONE SEEN   WBC, Wet Prep HPF POC MANY (A) NONE SEEN   Sperm NONE SEEN     Imaging:  No results found.  MAU Course: Orders Placed This Encounter  Procedures  . Wet prep, genital  . OB Urine Culture  . Urinalysis, Routine w reflex microscopic Urine, Clean Catch  . Discharge patient   No orders of the defined types were placed in this encounter.   MDM: UA, culture pending Wet prep +yeast GC/CT collected Will rx terazol cream, avoid IC x1 week given yeast and painful IC Recommend using lube once IC resumes  Assessment: 1. [redacted] weeks gestation of pregnancy   2. Supervision of other normal pregnancy, antepartum   3. Yeast vaginitis     Plan: Discharge home in stable condition.  Keep appointment as scheduled on 6/6 Follow up in MAU as needed for emergencies   Allergies as of 02/22/2021      Reactions   Amoxicillin Anaphylaxis      Medication List    STOP taking these medications   metroNIDAZOLE 0.75 % vaginal gel Commonly known as: METROGEL VAGINAL     TAKE these medications   aspirin 81 MG chewable tablet Chew 1 tablet (81 mg total) by mouth daily.   Blood Pressure Kit Devi 1 Device by Does not  apply route once a week.   famotidine 20 MG tablet Commonly known as: PEPCID Take 1 tablet (20 mg total) by mouth 2 (two) times daily.   prenatal vitamin w/FE, FA 27-1 MG Tabs tablet Take 1 tablet by mouth daily at 12 noon.   terconazole  0.4 % vaginal cream Commonly known as: TERAZOL 7 Place 1 applicator vaginally at bedtime. Use for seven days       Maryagnes Amos, MSN, CNM 02/22/2021 7:37 PM

## 2021-02-22 NOTE — MAU Note (Signed)
Pain during sex, first noted yesterday. Started noted vag discomfort on Wed, noted a thin, white d/c that day- no odor.  D/c in now yellow, no odor.  Seems like she has had problems with vagina since beginning of preg.

## 2021-02-24 LAB — CULTURE, OB URINE: Culture: 1000 — AB

## 2021-02-25 ENCOUNTER — Other Ambulatory Visit: Payer: Self-pay

## 2021-02-25 ENCOUNTER — Encounter: Payer: Self-pay | Admitting: Student

## 2021-02-25 ENCOUNTER — Ambulatory Visit (INDEPENDENT_AMBULATORY_CARE_PROVIDER_SITE_OTHER): Payer: Medicaid Other | Admitting: Medical

## 2021-02-25 VITALS — BP 108/61 | HR 74 | Wt 221.0 lb

## 2021-02-25 DIAGNOSIS — O09299 Supervision of pregnancy with other poor reproductive or obstetric history, unspecified trimester: Secondary | ICD-10-CM

## 2021-02-25 DIAGNOSIS — Z3480 Encounter for supervision of other normal pregnancy, unspecified trimester: Secondary | ICD-10-CM | POA: Diagnosis not present

## 2021-02-25 DIAGNOSIS — Z98891 History of uterine scar from previous surgery: Secondary | ICD-10-CM

## 2021-02-25 DIAGNOSIS — Z3A18 18 weeks gestation of pregnancy: Secondary | ICD-10-CM

## 2021-02-25 DIAGNOSIS — R8271 Bacteriuria: Secondary | ICD-10-CM | POA: Insufficient documentation

## 2021-02-25 LAB — GC/CHLAMYDIA PROBE AMP (~~LOC~~) NOT AT ARMC
Chlamydia: NEGATIVE
Comment: NEGATIVE
Comment: NORMAL
Neisseria Gonorrhea: NEGATIVE

## 2021-02-25 MED ORDER — GOJJI WEIGHT SCALE MISC
1.0000 [IU] | Freq: Every day | 0 refills | Status: DC
Start: 2021-02-25 — End: 2021-07-14

## 2021-02-25 NOTE — Progress Notes (Signed)
   PRENATAL VISIT NOTE  Subjective:  Sarah Copeland is a 25 y.o. G3P2002 at [redacted]w[redacted]d being seen today for ongoing prenatal care.  She is currently monitored for the following issues for this high-risk pregnancy and has Supervision of other normal pregnancy, antepartum; History of 2 cesarean sections; Hx of preeclampsia, prior pregnancy, currently pregnant; and Group B streptococcal bacteriuria on their problem list.  Patient reports no complaints.  Contractions: Not present. Vag. Bleeding: None.  Movement: Present. Denies leaking of fluid.   The following portions of the patient's history were reviewed and updated as appropriate: allergies, current medications, past family history, past medical history, past social history, past surgical history and problem list.   Objective:   Vitals:   02/25/21 1331  BP: 108/61  Pulse: 74  Weight: 221 lb (100.2 kg)    Fetal Status: Fetal Heart Rate (bpm): 147   Movement: Present     General:  Alert, oriented and cooperative. Patient is in no acute distress.  Skin: Skin is warm and dry. No rash noted.   Cardiovascular: Normal heart rate noted  Respiratory: Normal respiratory effort, no problems with respiration noted  Abdomen: Soft, gravid, appropriate for gestational age.  Pain/Pressure: Absent     Pelvic: Cervical exam deferred        Extremities: Normal range of motion.  Edema: None  Mental Status: Normal mood and affect. Normal behavior. Normal judgment and thought content.   Assessment and Plan:  Pregnancy: G3P2002 at [redacted]w[redacted]d 1. Supervision of other normal pregnancy, antepartum - AFP, Serum, Open Spina Bifida - Misc. Devices (GOJJI WEIGHT SCALE) MISC; 1 Units by Does not apply route daily.  Dispense: 1 each; Refill: 0 - Planning to breastfeed - Condoms for birth control - Has Peds list  - Anatomy US scheduled for 03/01/21 2. History of 2 cesarean sections - Planning repeat   3. Group B streptococcal bacteriuria - Treat if labor   4. Hx  of preeclampsia, prior pregnancy, currently pregnant - Normotensive today, continue to monitor   5. [redacted] weeks gestation of pregnancy  Preterm labor symptoms and general obstetric precautions including but not limited to vaginal bleeding, contractions, leaking of fluid and fetal movement were reviewed in detail with the patient. Please refer to After Visit Summary for other counseling recommendations.   Return in about 4 weeks (around 03/25/2021) for LOB, any provider, Virtual.  Future Appointments  Date Time Provider Department Center  03/01/2021  1:00 PM [facility]    Vonzella Nipple, PA-C

## 2021-02-25 NOTE — Patient Instructions (Signed)

## 2021-02-27 ENCOUNTER — Encounter: Payer: Self-pay | Admitting: *Deleted

## 2021-02-27 LAB — AFP, SERUM, OPEN SPINA BIFIDA
AFP MoM: 0.7
AFP Value: 27.7 ng/mL
Gest. Age on Collection Date: 18 weeks
Maternal Age At EDD: 24.2 yr
OSBR Risk 1 IN: 10000
Test Results:: NEGATIVE
Weight: 221 [lb_av]

## 2021-03-01 ENCOUNTER — Other Ambulatory Visit: Payer: Self-pay | Admitting: Obstetrics and Gynecology

## 2021-03-01 ENCOUNTER — Ambulatory Visit (HOSPITAL_COMMUNITY)
Admission: EM | Admit: 2021-03-01 | Discharge: 2021-03-01 | Disposition: A | Discharge status: Home / Self Care | Payer: Medicaid Other | Attending: Emergency Medicine | Admitting: Emergency Medicine

## 2021-03-01 ENCOUNTER — Other Ambulatory Visit: Payer: Self-pay

## 2021-03-01 ENCOUNTER — Encounter (HOSPITAL_COMMUNITY): Payer: Self-pay | Admitting: Obstetrics and Gynecology

## 2021-03-01 ENCOUNTER — Encounter (HOSPITAL_COMMUNITY): Payer: Self-pay

## 2021-03-01 ENCOUNTER — Emergency Department (HOSPITAL_COMMUNITY)
Admission: AD | Admit: 2021-03-01 | Discharge: 2021-03-02 | Disposition: A | Discharge status: Home / Self Care | Payer: Medicaid Other | Attending: Obstetrics and Gynecology | Admitting: Obstetrics and Gynecology

## 2021-03-01 ENCOUNTER — Ambulatory Visit: Payer: Medicaid Other | Attending: Obstetrics and Gynecology

## 2021-03-01 ENCOUNTER — Emergency Department (HOSPITAL_COMMUNITY): Payer: Medicaid Other

## 2021-03-01 DIAGNOSIS — Z3A19 19 weeks gestation of pregnancy: Secondary | ICD-10-CM | POA: Insufficient documentation

## 2021-03-01 DIAGNOSIS — Z5321 Procedure and treatment not carried out due to patient leaving prior to being seen by health care provider: Secondary | ICD-10-CM | POA: Insufficient documentation

## 2021-03-01 DIAGNOSIS — O99412 Diseases of the circulatory system complicating pregnancy, second trimester: Secondary | ICD-10-CM | POA: Diagnosis not present

## 2021-03-01 DIAGNOSIS — E669 Obesity, unspecified: Secondary | ICD-10-CM | POA: Insufficient documentation

## 2021-03-01 DIAGNOSIS — Z3480 Encounter for supervision of other normal pregnancy, unspecified trimester: Secondary | ICD-10-CM

## 2021-03-01 DIAGNOSIS — R079 Chest pain, unspecified: Secondary | ICD-10-CM | POA: Diagnosis not present

## 2021-03-01 DIAGNOSIS — R519 Headache, unspecified: Secondary | ICD-10-CM | POA: Diagnosis not present

## 2021-03-01 DIAGNOSIS — N644 Mastodynia: Secondary | ICD-10-CM | POA: Diagnosis not present

## 2021-03-01 DIAGNOSIS — O26892 Other specified pregnancy related conditions, second trimester: Secondary | ICD-10-CM | POA: Insufficient documentation

## 2021-03-01 LAB — CBC WITH DIFFERENTIAL/PLATELET
Abs Immature Granulocytes: 0.04 10*3/uL (ref 0.00–0.07)
Basophils Absolute: 0 10*3/uL (ref 0.0–0.1)
Basophils Relative: 0 %
Eosinophils Absolute: 0.2 10*3/uL (ref 0.0–0.5)
Eosinophils Relative: 2 %
HCT: 33.5 % — ABNORMAL LOW (ref 36.0–46.0)
Hemoglobin: 11 g/dL — ABNORMAL LOW (ref 12.0–15.0)
Immature Granulocytes: 1 %
Lymphocytes Relative: 29 %
Lymphs Abs: 2.5 10*3/uL (ref 0.7–4.0)
MCH: 22.4 pg — ABNORMAL LOW (ref 26.0–34.0)
MCHC: 32.8 g/dL (ref 30.0–36.0)
MCV: 68.1 fL — ABNORMAL LOW (ref 80.0–100.0)
Monocytes Absolute: 0.5 10*3/uL (ref 0.1–1.0)
Monocytes Relative: 6 %
Neutro Abs: 5.4 10*3/uL (ref 1.7–7.7)
Neutrophils Relative %: 62 %
Platelets: 231 10*3/uL (ref 150–400)
RBC: 4.92 MIL/uL (ref 3.87–5.11)
RDW: 14.7 % (ref 11.5–15.5)
WBC: 8.7 10*3/uL (ref 4.0–10.5)
nRBC: 0 % (ref 0.0–0.2)

## 2021-03-01 LAB — COMPREHENSIVE METABOLIC PANEL
ALT: 13 U/L (ref 0–44)
AST: 16 U/L (ref 15–41)
Albumin: 3.2 g/dL — ABNORMAL LOW (ref 3.5–5.0)
Alkaline Phosphatase: 68 U/L (ref 38–126)
Anion gap: 9 (ref 5–15)
BUN: 7 mg/dL (ref 6–20)
CO2: 20 mmol/L — ABNORMAL LOW (ref 22–32)
Calcium: 9.4 mg/dL (ref 8.9–10.3)
Chloride: 105 mmol/L (ref 98–111)
Creatinine, Ser: 0.5 mg/dL (ref 0.44–1.00)
GFR, Estimated: >60 mL/min (ref >60)
Glucose, Bld: 77 mg/dL (ref 70–99)
Potassium: 3.3 mmol/L — ABNORMAL LOW (ref 3.5–5.1)
Sodium: 134 mmol/L — ABNORMAL LOW (ref 135–145)
Total Bilirubin: 0.4 mg/dL (ref 0.3–1.2)
Total Protein: 7.3 g/dL (ref 6.5–8.1)

## 2021-03-01 LAB — TROPONIN I (HIGH SENSITIVITY)
Troponin I (High Sensitivity): 4 ng/L (ref <18)
Troponin I (High Sensitivity): 6 ng/L (ref <18)

## 2021-03-01 NOTE — ED Triage Notes (Signed)
Pt reports left sided chest pain, headache and lightheadedness since 1300 today. Pain is worse when taking a deep breath. Denies  abdominal pain, vision changes, nausea, weakness in arms or legs.   Pt reports she is [redacted] weeks pregnant. States she had an ultrasound today and the baby looks fine.

## 2021-03-01 NOTE — ED Triage Notes (Signed)
The pt is c/o chest pain  since earlier today with a headache  [redacted] weeks pregnant

## 2021-03-01 NOTE — MAU Provider Note (Signed)
S Sarah Copeland is a 25 y.o. G45P2002 female who presents to MAU today with complaint of left sided chest pain and HA. Reports onset around 1300 today. Pain is worse with inspiration. No SOB. No pregnancy complaints.   ROS: +CP +HA No SOB  O BP 113/64 (BP Location: Right Arm)   Pulse 88   Temp 98.6 F (37 C) (Oral)   Resp 16   Ht 5\' 7"  (1.702 m)   Wt 101 kg   LMP 10/20/2020 (Approximate)   SpO2 100%   BMI 34.88 kg/m  Physical Exam Vitals and nursing note reviewed.  Constitutional:      General: She is not in acute distress.    Appearance: Normal appearance.  HENT:     Head: Normocephalic and atraumatic.  Cardiovascular:     Rate and Rhythm: Normal rate.  Pulmonary:     Effort: Pulmonary effort is normal. No respiratory distress.  Musculoskeletal:     Cervical back: Normal range of motion.  Neurological:     General: No focal deficit present.     Mental Status: She is alert and oriented to person, place, and time.  Psychiatric:        Mood and Affect: Mood normal.        Behavior: Behavior normal.  FHT 156  MDM: No pregnancy complication identified. Will transfer to ED for cardiac workup. Dr. 10/22/2020 notified of presentation and clinical findings and accepts. Stable for transfer.  A 1. Chest pain, unspecified type   2. [redacted] weeks gestation of pregnancy     P Transfer to [facility] for further evaluation  Warning signs for worsening condition that would warrant emergency follow-up discussed Patient may return to MAU as needed for pregnancy related complaints  [facility], Donette Larry 03/01/2021 7:04 PM

## 2021-03-01 NOTE — MAU Note (Addendum)
Went to UC because she has this pain above her left breast. Hurts  to breath. It has been there since 1300. Is very tired, has no energy.  Has a HA, hasn't taken anything for it. They sent her here.  They did an EKG- 'said it was good."

## 2021-03-01 NOTE — ED Provider Notes (Signed)
Emergency Medicine Provider Triage Evaluation Note  [facility] , a 25 y.o. female  was evaluated in triage.  Pt complains of chest pain/breast pain that started earlier today around 1pm. Denies associated shortness of breath. Denies cough or leg swelling. She further reports a headache that feels consistent with her prior headaches  Pt is currently [redacted] weeks pregnant. Denies vaginal bleeding/cramping  Review of Systems  Positive: Chest pain, headaches Negative: Shortness of breath, cough, leg swelling  Physical Exam  BP 113/64 (BP Location: Right Arm)   Pulse 88   Temp 98.6 F (37 C) (Oral)   Resp 16   Ht 5\' 7"  (1.702 m)   Wt 101 kg   LMP 10/20/2020 (Approximate)   SpO2 100%   BMI 34.88 kg/m  Gen:   Awake, no distress   Resp:  Normal effort  MSK:   Moves extremities without difficulty  Other:  Heart with rrr, lungs ctab  Medical Decision Making  Medically screening exam initiated at 7:54 PM.  Appropriate orders placed.  [facility] was informed that the remainder of the evaluation will be completed by another provider, this initial triage assessment does not replace that evaluation, and the importance of remaining in the ED until their evaluation is complete.     [facility] 03/01/21 1958    05/01/21, MD 03/02/21 [phone]

## 2021-03-02 NOTE — ED Notes (Signed)
Patient was called x4 for roll call no answer

## 2021-03-04 ENCOUNTER — Telehealth: Payer: Self-pay

## 2021-03-04 NOTE — Telephone Encounter (Signed)
Transition Care Management Follow-up Telephone Call Date of discharge and from where: 03/02/2021 from [facility] How have you been since you were released from the hospital? Pt stated that she is feeling some better today. Pt did not have any questions or concerns at this time.  Any questions or concerns? No  Items Reviewed: Did the pt receive and understand the discharge instructions provided? Yes  Medications obtained and verified? Yes  Other? No  Any new allergies since your discharge? No  Dietary orders reviewed? No Do you have support at home? Yes   Functional Questionnaire: (I = Independent and D = Dependent) ADLs: I  Bathing/Dressing- I  Meal Prep- I  Eating- I  Maintaining continence- I  Transferring/Ambulation- I  Managing Meds- I   Follow up appointments reviewed:  PCP Hospital f/u appt confirmed? No  Specialist Hospital f/u appt confirmed? Yes Vonzella Nipple, PA-C on 03/29/2021 at 09:35am  Are transportation arrangements needed? No  If their condition worsens, is the pt aware to call PCP or go to the Emergency Dept.? Yes Was the patient provided with contact information for the PCP's office or ED? Yes Was to pt encouraged to call back with questions or concerns? Yes

## 2021-03-29 ENCOUNTER — Telehealth (INDEPENDENT_AMBULATORY_CARE_PROVIDER_SITE_OTHER): Payer: Medicaid Other | Admitting: Family

## 2021-03-29 ENCOUNTER — Encounter: Payer: Self-pay | Admitting: Medical

## 2021-03-29 VITALS — BP 126/72 | HR 76

## 2021-03-29 DIAGNOSIS — Z3A23 23 weeks gestation of pregnancy: Secondary | ICD-10-CM

## 2021-03-29 DIAGNOSIS — O09292 Supervision of pregnancy with other poor reproductive or obstetric history, second trimester: Secondary | ICD-10-CM

## 2021-03-29 DIAGNOSIS — O0990 Supervision of high risk pregnancy, unspecified, unspecified trimester: Secondary | ICD-10-CM

## 2021-03-29 DIAGNOSIS — Z148 Genetic carrier of other disease: Secondary | ICD-10-CM | POA: Insufficient documentation

## 2021-03-29 DIAGNOSIS — O09299 Supervision of pregnancy with other poor reproductive or obstetric history, unspecified trimester: Secondary | ICD-10-CM

## 2021-03-29 DIAGNOSIS — O34219 Maternal care for unspecified type scar from previous cesarean delivery: Secondary | ICD-10-CM

## 2021-03-29 DIAGNOSIS — Z98891 History of uterine scar from previous surgery: Secondary | ICD-10-CM

## 2021-03-29 NOTE — Progress Notes (Signed)
I connected with Sarah Copeland 03/29/21 at  9:35 AM EDT by: MyChart video and verified that I am speaking with the correct person using two identifiers.  Patient is located at home and provider is located at [facility].     The purpose of this virtual visit is to provide medical care while limiting exposure to the novel coronavirus. I discussed the limitations, risks, security and privacy concerns of performing an evaluation and management service by MyChart video and the availability of in person appointments. I also discussed with the patient that there may be a patient responsible charge related to this service. By engaging in this virtual visit, you consent to the provision of healthcare.  Additionally, you authorize for your insurance to be billed for the services provided during this visit.  The patient expressed understanding and agreed to proceed.  The following staff members participated in the virtual visit:  Ko Vaya, CMA, Amedeo Gory, [place]     PRENATAL VISIT NOTE  Subjective:  Sarah Copeland is a 25 y.o. G3P2002 at [redacted]w[redacted]d  for phone visit for ongoing prenatal care.  She is currently monitored for the following issues for this high-risk pregnancy and has Supervision of other normal pregnancy, antepartum; History of 2 cesarean sections; Hx of preeclampsia, prior pregnancy, currently pregnant; Group B streptococcal bacteriuria; and Genetic carrier on their problem list.  Nao expressed increased stress due to custody issues and difficulty with employment due to misdemeanor on file.    Patient reports intermittent backpain.  Contractions: Irritability. Vag. Bleeding: None.  Movement: Present. Denies leaking of fluid.   The following portions of the patient's history were reviewed and updated as appropriate: allergies, current medications, past family history, past medical history, past social history, past surgical history and problem list.   Objective:    Vitals:   03/29/21 0944  BP: 126/72  Pulse: 76   Self-Obtained  Fetal Status:     Movement: Present     Assessment and Plan:  Pregnancy: G3P2002 at [redacted]w[redacted]d 1. Supervision of high risk pregnancy, antepartum  - Discussed third trimester labs for next visit - Reviewed warning signs of pregnancy - Reviewed ultrasound results (normal)  2. History of 2 cesarean sections - Explained will be scheduled for repeat c-section at 39 weeks  3. Hx of preeclampsia, prior pregnancy, currently pregnant - Continue with ASA  4.  + High Risk HPV - Explained the implications of +HR HPV, close monitoring of annual pap smears  Preterm labor symptoms and general obstetric precautions including but not limited to vaginal bleeding, contractions, leaking of fluid and fetal movement were reviewed in detail with the patient.  Return in about 4 weeks (around 04/26/2021) for LOB, 28 week labs (fasting), In-Person.  Future Appointments  Date Time Provider Department Center  04/26/2021  8:35 AM Genene Churn [facility]  04/26/2021  8:50 AM [facility]     Time spent on virtual visit: 20 minutes  Amedeo Gory, CNM

## 2021-03-29 NOTE — Progress Notes (Signed)
I connected with  Sarah Copeland on 03/29/21 at  9:35 AM EDT by MyChart Virtual Video Visit and verified that I am speaking with the correct person using two identifiers.   I discussed the limitations, risks, security and privacy concerns of performing an evaluation and management service by telephone and the availability of in person appointments. I also discussed with the patient that there may be a patient responsible charge related to this service. The patient expressed understanding and agreed to proceed.  Guy Begin, CMA 03/29/2021  9:39 AM

## 2021-03-29 NOTE — Progress Notes (Signed)
.  cwh 

## 2021-03-30 ENCOUNTER — Inpatient Hospital Stay (HOSPITAL_COMMUNITY)
Admission: AD | Admit: 2021-03-30 | Discharge: 2021-03-30 | Disposition: A | Discharge status: Home / Self Care | Payer: Medicaid Other | Attending: Obstetrics & Gynecology | Admitting: Obstetrics & Gynecology

## 2021-03-30 ENCOUNTER — Other Ambulatory Visit: Payer: Self-pay

## 2021-03-30 DIAGNOSIS — Z3A23 23 weeks gestation of pregnancy: Secondary | ICD-10-CM | POA: Diagnosis not present

## 2021-03-30 DIAGNOSIS — O98812 Other maternal infectious and parasitic diseases complicating pregnancy, second trimester: Secondary | ICD-10-CM | POA: Diagnosis not present

## 2021-03-30 DIAGNOSIS — O23592 Infection of other part of genital tract in pregnancy, second trimester: Secondary | ICD-10-CM | POA: Insufficient documentation

## 2021-03-30 DIAGNOSIS — O98819 Other maternal infectious and parasitic diseases complicating pregnancy, unspecified trimester: Secondary | ICD-10-CM

## 2021-03-30 DIAGNOSIS — B373 Candidiasis of vulva and vagina: Secondary | ICD-10-CM | POA: Insufficient documentation

## 2021-03-30 LAB — WET PREP, GENITAL
Clue Cells Wet Prep HPF POC: NONE SEEN
Sperm: NONE SEEN
Trich, Wet Prep: NONE SEEN

## 2021-03-30 MED ORDER — FLUCONAZOLE 150 MG PO TABS: 150.0000 mg | ORAL_TABLET | Freq: Every day | ORAL | 0 refills | Status: DC

## 2021-03-30 NOTE — MAU Note (Signed)
Pt reports to mau with c/o vaginal itching.  Pt reports itching started yesterday.  Denies irregular dc or bleeding. Denies pain today.

## 2021-03-30 NOTE — Discharge Instructions (Signed)

## 2021-03-30 NOTE — MAU Provider Note (Signed)
S Sarah Copeland is a 25 y.o. [phone] patient who presents to MAU today with complaint of vaginal itching with white discharge. She denies any bleeding or pain. She reports normal fetal movement. She reports ongoing yeast infections throughout the pregnancy   O BP (!) 119/57 (BP Location: Right Arm)   Pulse 97   Temp 98.6 F (37 C) (Oral)   Resp 17   LMP 10/20/2020 (Approximate)   SpO2 98%  Physical Exam Vitals and nursing note reviewed.  Constitutional:      General: She is not in acute distress.    Appearance: She is well-developed.  HENT:     Head: Normocephalic.  Eyes:     Pupils: Pupils are equal, round, and reactive to light.  Cardiovascular:     Rate and Rhythm: Normal rate.  Pulmonary:     Effort: Pulmonary effort is normal. No respiratory distress.  Abdominal:     General: There is no distension.     Palpations: Abdomen is soft.     Tenderness: There is no abdominal tenderness.  Skin:    General: Skin is warm and dry.  Neurological:     Mental Status: She is alert and oriented to person, place, and time.  Psychiatric:        Mood and Affect: Mood normal.        Behavior: Behavior normal.        Thought Content: Thought content normal.        Judgment: Judgment normal.   Results for orders placed or performed during the hospital encounter of 03/30/21 (from the past 24 hour(s))  Wet prep, genital     Status: Abnormal   Collection Time: 03/30/21  4:36 PM   Specimen: Vaginal  Result Value Ref Range   Yeast Wet Prep HPF POC PRESENT (A) NONE SEEN   Trich, Wet Prep NONE SEEN NONE SEEN   Clue Cells Wet Prep HPF POC NONE SEEN NONE SEEN   WBC, Wet Prep HPF POC MANY (A) NONE SEEN   Sperm NONE SEEN     A Medical screening exam complete 1. Candidiasis of vagina during pregnancy   2. [redacted] weeks gestation of pregnancy      P Discharge from MAU in stable condition RX for diflucan sent to patient's pharmacy Warning signs for worsening condition that would  warrant emergency follow-up discussed Patient may return to MAU as needed   Rolm Bookbinder, [place] 03/30/2021 5:40 PM

## 2021-04-01 LAB — GC/CHLAMYDIA PROBE AMP (~~LOC~~) NOT AT ARMC
Chlamydia: NEGATIVE
Comment: NEGATIVE
Comment: NORMAL
Neisseria Gonorrhea: NEGATIVE

## 2021-04-17 ENCOUNTER — Telehealth: Payer: Self-pay | Admitting: Family Medicine

## 2021-04-17 DIAGNOSIS — F439 Reaction to severe stress, unspecified: Secondary | ICD-10-CM

## 2021-04-17 NOTE — Telephone Encounter (Signed)
Patient is requesting a referral to [facility] in [place] for Anger Management PH [phone]

## 2021-04-18 NOTE — Telephone Encounter (Signed)
Returned pt's call and pt informed me that she needed a referral for anger management because she is going through a custody battle.  I explained to the pt that I can send a referral to our [facility] to see if she get resources.  Pt then asked for a work restriction letter.  Pt reports that she has started a new job from [employer] and they are needing a letter to say that she can work and that she can sit. I advised pt that I would not recommend sitting for long periods of time as it cause her pain or discomfort.  I informed pt that the restriction states no prolonged standing and is that sufficient for her job.  Pt stated yes with no further questions.   Sarah Copeland  04/18/21

## 2021-04-23 ENCOUNTER — Inpatient Hospital Stay (HOSPITAL_COMMUNITY)
Admission: AD | Admit: 2021-04-23 | Discharge: 2021-04-23 | Disposition: A | Discharge status: Home / Self Care | Payer: Medicaid Other | Attending: Obstetrics and Gynecology | Admitting: Obstetrics and Gynecology

## 2021-04-23 DIAGNOSIS — Z880 Allergy status to penicillin: Secondary | ICD-10-CM | POA: Insufficient documentation

## 2021-04-23 DIAGNOSIS — Z653 Problems related to other legal circumstances: Secondary | ICD-10-CM | POA: Insufficient documentation

## 2021-04-23 DIAGNOSIS — O98812 Other maternal infectious and parasitic diseases complicating pregnancy, second trimester: Secondary | ICD-10-CM | POA: Insufficient documentation

## 2021-04-23 DIAGNOSIS — B373 Candidiasis of vulva and vagina: Secondary | ICD-10-CM | POA: Diagnosis not present

## 2021-04-23 DIAGNOSIS — Z79899 Other long term (current) drug therapy: Secondary | ICD-10-CM | POA: Diagnosis not present

## 2021-04-23 DIAGNOSIS — O0990 Supervision of high risk pregnancy, unspecified, unspecified trimester: Secondary | ICD-10-CM

## 2021-04-23 DIAGNOSIS — O26892 Other specified pregnancy related conditions, second trimester: Secondary | ICD-10-CM | POA: Diagnosis not present

## 2021-04-23 DIAGNOSIS — Z7982 Long term (current) use of aspirin: Secondary | ICD-10-CM | POA: Insufficient documentation

## 2021-04-23 DIAGNOSIS — F17290 Nicotine dependence, other tobacco product, uncomplicated: Secondary | ICD-10-CM | POA: Insufficient documentation

## 2021-04-23 DIAGNOSIS — O99332 Smoking (tobacco) complicating pregnancy, second trimester: Secondary | ICD-10-CM | POA: Diagnosis not present

## 2021-04-23 DIAGNOSIS — Z3A26 26 weeks gestation of pregnancy: Secondary | ICD-10-CM

## 2021-04-23 DIAGNOSIS — B3731 Acute candidiasis of vulva and vagina: Secondary | ICD-10-CM | POA: Insufficient documentation

## 2021-04-23 LAB — WET PREP, GENITAL
Clue Cells Wet Prep HPF POC: NONE SEEN
Sperm: NONE SEEN
Trich, Wet Prep: NONE SEEN

## 2021-04-23 MED ORDER — TERCONAZOLE 0.4 % VA CREA: 1.0000 | TOPICAL_CREAM | Freq: Every day | VAGINAL | 0 refills | Status: DC

## 2021-04-23 NOTE — MAU Provider Note (Addendum)
History    Chief Complaint  Patient presents with   Vaginal Itching   Vaginal Itching The patient's primary symptoms include vaginal discharge. The patient's pertinent negatives include no pelvic pain. Pertinent negatives include no abdominal pain, chills, constipation, diarrhea, dysuria, fever, flank pain, frequency, headaches, nausea, urgency or vomiting. 25 yo G3P2002 @ [redacted]w[redacted]d presenting with vaginal itching and burning that started last night along with thick, yellow-white vaginal discharge that is increased in amount compared to her normal discharge. She states she has had vaginal discharge and either BV or yeast multiple times this pregancy. She did just switch body wash but otherwise has been wearing loose cotton clothing. Patient denies washing inside the vagina. Is under significant stressors from custody battle for her other children. Denies dysuria. Otherwise Denies contractions, vaginal bleeding, loss of fluid. Endorses good fetal movement. Denies headaches, vision changes, shortness of breath or chest pain. Patient has normal HgbA1C and negative HIV test early in pregnancy and is scheduled for repeat testing at next OB visit. Patient denies intercourse since start of pregnancy. Patient only allergic to Amoxicillin. Patient has previously used terconazole and one pill of Diflucan and reports some relief for 1-2 days after administration, but states that the infection and symptoms returned shortly after.  OB History     Gravida  3   Para  2   Term  2   Preterm      AB      Living  2      SAB      IAB      Ectopic      Multiple      Live Births  2        Obstetric Comments  1- Pre E 2- wasn't dilating         Past Medical History:  Diagnosis Date   Anemia    Anxiety    Depression    stressed, going through custody battle   Headache    Kidney stones    Medical history non-contributory    Preeclampsia 2018   Vaginal Pap smear, abnormal    f/u PP     Past Surgical History:  Procedure Laterality Date   CESAREAN SECTION      Family History  Problem Relation Age of Onset   Depression Mother    Hypertension Mother    Bipolar disorder Mother    Schizophrenia Mother    Kidney disease Father        66- kidney failure    Social History   Tobacco Use   Smoking status: Every Day    Types: Cigars   Smokeless tobacco: Never   Tobacco comments:    black and mild  Vaping Use   Vaping Use: Never used  Substance Use Topics   Alcohol use: Never   Drug use: Never    Allergies:  Allergies  Allergen Reactions   Amoxicillin Anaphylaxis    Medications Prior to Admission  Medication Sig Dispense Refill Last Dose   albuterol (VENTOLIN HFA) 108 (90 Base) MCG/ACT inhaler Inhale into the lungs.      aspirin 81 MG chewable tablet Chew 1 tablet (81 mg total) by mouth daily. 30 tablet 5    Blood Pressure Monitoring (BLOOD PRESSURE KIT) DEVI 1 Device by Does not apply route once a week. 1 each 0    famotidine (PEPCID) 20 MG tablet Take 1 tablet (20 mg total) by mouth 2 (two) times daily. 60 tablet 6    fluconazole (  DIFLUCAN) 150 MG tablet Take 1 tablet (150 mg total) by mouth daily. 2 tablet 0    Misc. Devices (GOJJI WEIGHT SCALE) MISC 1 Units by Does not apply route daily. 1 each 0    prenatal vitamin w/FE, FA (PRENATAL 1 + 1) 27-1 MG TABS tablet Take 1 tablet by mouth daily at 12 noon. 30 tablet 11     Review of Systems  Constitutional:  Negative for chills, diaphoresis, fatigue and fever.  Eyes:  Negative for visual disturbance.  Respiratory:  Negative for shortness of breath.   Cardiovascular:  Negative for chest pain.  Gastrointestinal:  Negative for abdominal pain, constipation, diarrhea, nausea and vomiting.  Genitourinary:  Positive for vaginal discharge. Negative for dysuria, flank pain, frequency, pelvic pain, urgency and vaginal bleeding.  Neurological:  Negative for dizziness, weakness, light-headedness and headaches.    Physical Exam Blood pressure 109/70, temperature 98.9 F (37.2 C), temperature source Oral, resp. rate 16, last menstrual period 10/20/2020, SpO2 100 %.  Patient Vitals for the past 24 hrs:  BP Temp Temp src Pulse Resp SpO2  04/23/21 1237 105/71 -- -- 86 15 98 %  04/23/21 1118 109/70 98.9 F (37.2 C) Oral -- 16 100 %   Physical Exam Constitutional:      Appearance: Normal appearance.  HENT:     Head: Normocephalic and atraumatic.  Cardiovascular:     Rate and Rhythm: Normal rate and regular rhythm.     Pulses: Normal pulses.     Heart sounds: Normal heart sounds.  Pulmonary:     Effort: Pulmonary effort is normal. No respiratory distress.     Breath sounds: Normal breath sounds. No wheezing.  Abdominal:     Palpations: Abdomen is soft.     Comments: gravid  Skin:    General: Skin is warm and dry.     Capillary Refill: Capillary refill takes less than 2 seconds.  Neurological:     General: No focal deficit present.     Mental Status: She is alert.   FHT: 140 bpm  MAU Course Procedures  MDM Vaginal Itching Symptoms consistent with yeast infection, wet prep without enough saline. Has tried both terconazole and diflucan with brief resolution prior to re-infection. Will send another sample for fungal culture to identify yeast species and treat with Terconazole 1 applicator for 7 days - Recommend f/u with prenatal provider to discuss lab results and potential need for suppressive therapy. -pending G/C - Fungal Culture to isolate species due to recurrent fungal infections  Waldon Merl MD  Assessment and Plan  1. Vaginal yeast infection   2. Supervision of high risk pregnancy, antepartum   3. [redacted] weeks gestation of pregnancy     Allergies as of 04/23/2021       Reactions   Amoxicillin Anaphylaxis        Medication List     STOP taking these medications    fluconazole 150 MG tablet Commonly known as: Diflucan       TAKE these medications     albuterol 108 (90 Base) MCG/ACT inhaler Commonly known as: VENTOLIN HFA Inhale into the lungs.   aspirin 81 MG chewable tablet Chew 1 tablet (81 mg total) by mouth daily.   Blood Pressure Kit Devi 1 Device by Does not apply route once a week.   famotidine 20 MG tablet Commonly known as: PEPCID Take 1 tablet (20 mg total) by mouth 2 (two) times daily.   Gojji Weight Scale Misc 1 Units by Does  not apply route daily.   prenatal vitamin w/FE, FA 27-1 MG Tabs tablet Take 1 tablet by mouth daily at 12 noon.   terconazole 0.4 % vaginal cream Commonly known as: TERAZOL 7 Place 1 applicator vaginally at bedtime for 7 days.       -fungal culture results pending -RX terconazole -pt to discuss with OB regarding suppressive therapy -return MAU precautions given -pt discharged to home in stable condition  Pedram Goodchild, Gerrie Nordmann, NP  1:11 PM 04/23/2021   Attestation of Supervision of Student:  I confirm that I have verified the information documented in the medical student's note and that I have also personally reperformed the history, physical exam and all medical decision making activities.  I have verified that all services and findings are accurately documented in this student's note; and I agree with management and plan as outlined in the documentation. I have also made any necessary editorial changes.  Clarisa Fling, NP [facility] 04/23/2021 1:11 PM

## 2021-04-23 NOTE — MAU Note (Signed)
...  Sarah Copeland is a 25 y.o. at [redacted]w[redacted]d here in MAU reporting: Patient states, "Girl I'm here for the same thing. I can not get rid of this." She states her vaginal discharge is at times thin and watery and other times white/yellow and thicker. She states she is not experiencing vaginal burning with urination but states she is experiencing vaginal itching "on the itching." She states she switched her body wash from the dove soap to a "sulfate free" body wash. +FM. No VB.   States she has not had sexual intercourse with her partner since becoming pregnant.

## 2021-04-24 ENCOUNTER — Telehealth: Payer: Self-pay | Admitting: Family Medicine

## 2021-04-24 LAB — GC/CHLAMYDIA PROBE AMP (~~LOC~~) NOT AT ARMC
Chlamydia: NEGATIVE
Comment: NEGATIVE
Comment: NORMAL
Neisseria Gonorrhea: NEGATIVE

## 2021-04-24 NOTE — Telephone Encounter (Signed)
Spoke with pt. Pt states passed out at work today from being overheated. Pt states she was sitting down when she passed out and did not hit her abd.. Pt states has +FM.  Pt states EMS checked her BS and it was 58. Pt states BS came up after juice and cookies to 85. Pt is currently at home and feeling much better.  Pt advised to continue to keep hydrated and to eat small meals and take breaks to stay cool throughout the day. Pt agreeable to plan of care. Pt verbalized understanding.  Pt has next OB appt on 05/07/21 with GTT. Judeth Cornfield, RN

## 2021-04-24 NOTE — Telephone Encounter (Signed)
Patient passed out at work today due to her sugar being low, patient want to know if it's anything she do

## 2021-04-26 ENCOUNTER — Encounter: Payer: Medicaid Other | Admitting: Family

## 2021-04-26 ENCOUNTER — Other Ambulatory Visit: Payer: Medicaid Other

## 2021-04-30 ENCOUNTER — Inpatient Hospital Stay (HOSPITAL_COMMUNITY)
Admission: AD | Admit: 2021-04-30 | Discharge: 2021-05-01 | Disposition: A | Discharge status: Home / Self Care | Payer: Medicaid Other | Attending: Obstetrics & Gynecology | Admitting: Obstetrics & Gynecology

## 2021-04-30 DIAGNOSIS — O26892 Other specified pregnancy related conditions, second trimester: Secondary | ICD-10-CM | POA: Insufficient documentation

## 2021-04-30 DIAGNOSIS — R0602 Shortness of breath: Secondary | ICD-10-CM | POA: Insufficient documentation

## 2021-04-30 DIAGNOSIS — R0789 Other chest pain: Secondary | ICD-10-CM | POA: Diagnosis not present

## 2021-04-30 DIAGNOSIS — O26899 Other specified pregnancy related conditions, unspecified trimester: Secondary | ICD-10-CM

## 2021-04-30 DIAGNOSIS — R6889 Other general symptoms and signs: Secondary | ICD-10-CM | POA: Diagnosis not present

## 2021-04-30 DIAGNOSIS — Z3A27 27 weeks gestation of pregnancy: Secondary | ICD-10-CM | POA: Insufficient documentation

## 2021-04-30 DIAGNOSIS — Z880 Allergy status to penicillin: Secondary | ICD-10-CM | POA: Insufficient documentation

## 2021-04-30 DIAGNOSIS — R079 Chest pain, unspecified: Secondary | ICD-10-CM | POA: Diagnosis not present

## 2021-04-30 DIAGNOSIS — Z743 Need for continuous supervision: Secondary | ICD-10-CM | POA: Diagnosis not present

## 2021-04-30 DIAGNOSIS — Z20822 Contact with and (suspected) exposure to covid-19: Secondary | ICD-10-CM | POA: Insufficient documentation

## 2021-04-30 NOTE — MAU Note (Signed)
Pt arrived in MAU via EMS from home after one episode of SOB at home occurring around 2230-2300 lasting approximately 45 min.  Pt states that she felt short of breath and some pressure on her chest while watching TV at home.  Pt states that she has been feeling her baby move, denies LOF or vaginal bleeding.  Pt states she had one episode of syncope last week while at work, was told that her blood glucose was "a little low" after the episode by EMS.  Pt states she has been under a lot of personal stress lately as well.

## 2021-05-01 ENCOUNTER — Encounter (HOSPITAL_COMMUNITY): Payer: Self-pay | Admitting: Obstetrics & Gynecology

## 2021-05-01 DIAGNOSIS — Z20822 Contact with and (suspected) exposure to covid-19: Secondary | ICD-10-CM | POA: Diagnosis not present

## 2021-05-01 DIAGNOSIS — R0602 Shortness of breath: Secondary | ICD-10-CM | POA: Diagnosis not present

## 2021-05-01 DIAGNOSIS — O26892 Other specified pregnancy related conditions, second trimester: Secondary | ICD-10-CM

## 2021-05-01 DIAGNOSIS — Z3A27 27 weeks gestation of pregnancy: Secondary | ICD-10-CM

## 2021-05-01 DIAGNOSIS — Z880 Allergy status to penicillin: Secondary | ICD-10-CM | POA: Diagnosis not present

## 2021-05-01 LAB — SARS CORONAVIRUS 2 (TAT 6-24 HRS): SARS Coronavirus 2: NEGATIVE

## 2021-05-01 MED ORDER — TERCONAZOLE 0.4 % VA CREA: 1.0000 | TOPICAL_CREAM | Freq: Every day | VAGINAL | 0 refills | Status: AC

## 2021-05-01 NOTE — MAU Provider Note (Signed)
Chief Complaint:  No chief complaint on file.   Event Date/Time   First Provider Initiated Contact with Patient 05/01/21 0006     HPI: Sarah Copeland is a 25 y.o. G3P2002 at 52w5dwho presents via EMS to maternity admissions reporting one episode of feeling short of breath, now resolved.  States it felt like pressure on her chest but "I think its just my girls being heavy, because when I took my bra off it felt better".  History of hypoglycemic episodes, but none today.  States has low appetite.  She reports good fetal movement, denies LOF, vaginal bleeding, vaginal itching/burning, urinary symptoms, h/a, dizziness, n/v, diarrhea, constipation or fever/chills.    Shortness of Breath This is a new problem. The current episode started today. The problem has been resolved. The average episode lasts 45 minutes. Associated symptoms include rhinorrhea. Pertinent negatives include no abdominal pain, chest pain, fever, headaches, leg pain, orthopnea, sore throat, sputum production, syncope, vomiting or wheezing. Nothing aggravates the symptoms. The patient has no known risk factors for DVT/PE. She has tried nothing for the symptoms. There is no history of asthma, chronic lung disease or pneumonia.   RN Note: Pt arrived in MAU via EMS from home after one episode of SOB at home occurring around 2230-2300 lasting approximately 45 min.  Pt states that she felt short of breath and some pressure on her chest while watching TV at home.  Pt states that she has been feeling her baby move, denies LOF or vaginal bleeding.  Pt states she had one episode of syncope last week while at work, was told that her blood glucose was "a little low" after the episode by EMS.  Pt states she has been under a lot of personal stress lately as well.  Past Medical History: Past Medical History:  Diagnosis Date   Anemia    Anxiety    Depression    stressed, going through custody battle   Headache    Kidney stones    Medical history  non-contributory    Preeclampsia 2018   Vaginal Pap smear, abnormal    f/u PP    Past obstetric history: OB History  Gravida Para Term Preterm AB Living  $Remov'3 2 2     2  'wsVZiF$ SAB IAB Ectopic Multiple Live Births          2    # Outcome Date GA Lbr Len/2nd Weight Sex Delivery Anes PTL Lv  3 Current           2 Term 01/19/19    M CS-Unspec   LIV  1 Term 12/17/16    F CS-Unspec   LIV    Obstetric Comments  1- Pre E  2- wasn't dilating    Past Surgical History: Past Surgical History:  Procedure Laterality Date   CESAREAN SECTION      Family History: Family History  Problem Relation Age of Onset   Depression Mother    Hypertension Mother    Bipolar disorder Mother    Schizophrenia Mother    Kidney disease Father        8- kidney failure    Social History: Social History   Tobacco Use   Smoking status: Every Day    Types: Cigars   Smokeless tobacco: Never   Tobacco comments:    black and mild  Vaping Use   Vaping Use: Never used  Substance Use Topics   Alcohol use: Never   Drug use: Never    Allergies:  Allergies  Allergen Reactions   Amoxicillin Anaphylaxis    Meds:  Medications Prior to Admission  Medication Sig Dispense Refill Last Dose   prenatal vitamin w/FE, FA (PRENATAL 1 + 1) 27-1 MG TABS tablet Take 1 tablet by mouth daily at 12 noon. 30 tablet 11 04/30/2021   albuterol (VENTOLIN HFA) 108 (90 Base) MCG/ACT inhaler Inhale into the lungs.      aspirin 81 MG chewable tablet Chew 1 tablet (81 mg total) by mouth daily. 30 tablet 5    Blood Pressure Monitoring (BLOOD PRESSURE KIT) DEVI 1 Device by Does not apply route once a week. 1 each 0    famotidine (PEPCID) 20 MG tablet Take 1 tablet (20 mg total) by mouth 2 (two) times daily. 60 tablet 6    Misc. Devices (GOJJI WEIGHT SCALE) MISC 1 Units by Does not apply route daily. 1 each 0    [EXPIRED] terconazole (TERAZOL 7) 0.4 % vaginal cream Place 1 applicator vaginally at bedtime for 7 days. 45 g 0     I  have reviewed patient's Past Medical Hx, Surgical Hx, Family Hx, Social Hx, medications and allergies.   ROS:  Review of Systems  Constitutional:  Negative for fever.  HENT:  Positive for rhinorrhea. Negative for sore throat.   Respiratory:  Positive for shortness of breath. Negative for sputum production and wheezing.   Cardiovascular:  Negative for chest pain, orthopnea and syncope.  Gastrointestinal:  Negative for abdominal pain and vomiting.  Neurological:  Negative for headaches.  Other systems negative  Physical Exam  No data found. Constitutional: Well-developed, well-nourished female in no acute distress.  Cardiovascular: normal rate and rhythm Respiratory: normal effort, clear to auscultation bilaterally GI: Abd soft, non-tender, gravid appropriate for gestational age.   No rebound or guarding. MS: Extremities nontender, no edema, normal ROM Neurologic: Alert and oriented x 4.  GU: Neg CVAT.  PELVIC EXAM: deferred   FHT:  Baseline 140 , moderate variability, accelerations present, no decelerations  Reassuring for gestational age Contractions:   Rare   Labs: CBG normal per EMS EKG normal sinus rhythm per EMS O/Positive/-- (04/19 7169)  Imaging:  No results found.  MAU Course/MDM: I have ordered covid test due to rhinorrhea and pt request.   No other symptoms NST reviewed, reassuring for gestational age  Treatments in MAU included EFM Discussed normal exam findings with patient.  Chest xray not indicated as she is no longer short of breath and exam is normal    EKG normal on EMS truck  Patient states she is reassured since the episode stopped and wants to go home.    Assessment: Single IUP at [redacted]w[redacted]d Single episode of shortness of breath, now resolved Reassuring EFM tracing  Plan: Discharge home Reassurance given Preterm Labor precautions and fetal kick counts Follow up in Office for prenatal visits  Encouraged to return if she develops worsening of symptoms,  increase in pain, fever, or other concerning symptoms.   Pt stable at time of discharge.  Hansel Feinstein CNM, MSN Certified Nurse-Midwife 05/01/2021 12:06 AM

## 2021-05-07 ENCOUNTER — Ambulatory Visit (INDEPENDENT_AMBULATORY_CARE_PROVIDER_SITE_OTHER): Payer: Medicaid Other | Admitting: Family Medicine

## 2021-05-07 ENCOUNTER — Other Ambulatory Visit: Payer: Medicaid Other

## 2021-05-07 ENCOUNTER — Other Ambulatory Visit: Payer: Self-pay

## 2021-05-07 ENCOUNTER — Other Ambulatory Visit: Payer: Self-pay | Admitting: General Practice

## 2021-05-07 VITALS — BP 110/73 | HR 76 | Wt 218.6 lb

## 2021-05-07 DIAGNOSIS — F419 Anxiety disorder, unspecified: Secondary | ICD-10-CM

## 2021-05-07 DIAGNOSIS — O0990 Supervision of high risk pregnancy, unspecified, unspecified trimester: Secondary | ICD-10-CM

## 2021-05-07 DIAGNOSIS — Z23 Encounter for immunization: Secondary | ICD-10-CM | POA: Diagnosis not present

## 2021-05-07 DIAGNOSIS — Z98891 History of uterine scar from previous surgery: Secondary | ICD-10-CM

## 2021-05-07 DIAGNOSIS — O09299 Supervision of pregnancy with other poor reproductive or obstetric history, unspecified trimester: Secondary | ICD-10-CM

## 2021-05-07 DIAGNOSIS — Z5941 Food insecurity: Secondary | ICD-10-CM

## 2021-05-07 DIAGNOSIS — Z3480 Encounter for supervision of other normal pregnancy, unspecified trimester: Secondary | ICD-10-CM | POA: Diagnosis not present

## 2021-05-07 DIAGNOSIS — R8271 Bacteriuria: Secondary | ICD-10-CM

## 2021-05-07 MED ORDER — ASPIRIN 81 MG PO CHEW
81.0000 mg | CHEWABLE_TABLET | Freq: Every day | ORAL | 5 refills | Status: DC
Start: 2021-05-07 — End: 2021-07-22

## 2021-05-07 NOTE — Progress Notes (Signed)
Subjective:  Sarah Copeland is a 25 y.o. G3P2002 at [redacted]w[redacted]d being seen today for ongoing prenatal care.  She is currently monitored for the following issues for this high-risk pregnancy and has Supervision of high risk pregnancy, antepartum; History of 2 cesarean sections; Hx of preeclampsia, prior pregnancy, currently pregnant; Group B streptococcal bacteriuria; Genetic carrier; and Vaginal yeast infection on their problem list.  Patient reports backache.  Contractions: Not present. Vag. Bleeding: None.  Movement: Present. Denies leaking of fluid.   The following portions of the patient's history were reviewed and updated as appropriate: allergies, current medications, past family history, past medical history, past social history, past surgical history and problem list. Problem list updated.  Objective:   Vitals:   05/07/21 0855  BP: 110/73  Pulse: 76  Weight: 218 lb 9.6 oz (99.2 kg)    Fetal Status: Fetal Heart Rate (bpm): 134 Fundal Height: 28 cm Movement: Present     General:  Alert, oriented and cooperative. Patient is in no acute distress.  Skin: Skin is warm and dry. No rash noted.   Cardiovascular: Normal heart rate noted  Respiratory: Normal respiratory effort, no problems with respiration noted  Abdomen: Soft, gravid, appropriate for gestational age. Pain/Pressure: Present     Pelvic: Vag. Bleeding: None     Cervical exam deferred        Extremities: Normal range of motion.  Edema: None  Mental Status: Normal mood and affect. Normal behavior. Normal judgment and thought content.   Urinalysis:      Assessment and Plan:  Pregnancy: G3P2002 at [redacted]w[redacted]d  1. Supervision of normal pregnancy, antepartum Doing well. No acute obstetric concerns today. TDAP and GTT today - Tdap vaccine greater than or equal to 7yo IM  - Discussed COVID vaccine booster - defers to next visit as has work today and tomorrow  2. History of 2 cesarean sections Patient with history of 2 prior  c-sections. Patient previously reported planning repeat C-section. Today patient reports she desires to trial vaginal delivery if goes into labor on own. Discussed risks and benefits of VBAC given history of prior cesareans. Expressing understanding and agreement with recommendation of repeat C-section if does not go into labor on own. - VBAC consent signed - Message sent to schedule c-section   3. Hx of preeclampsia, prior pregnancy, currently pregnant Patient reports ran out of ASA, will send refills. Discussed BP monitoring and symptoms of pre-eclampsia and when to go to MAU/call. - aspirin 81 MG chewable tablet; Chew 1 tablet (81 mg total) by mouth daily.  Dispense: 30 tablet; Refill: 5  4. Group B streptococcal bacteriuria Plan for treatment in labor  5. Anxiety Patient with history of anxiety and depression. Scoring high on [place]. Reports situationally worsened due to current custody battle and other family and social stressors. Would like to chat with our [facility] provider. - Ambulatory referral to Integrated Behavioral Health  6. Food insecurity - AMBULATORY REFERRAL TO [facility]  Preterm labor symptoms and general obstetric precautions including but not limited to vaginal bleeding, contractions, leaking of fluid and fetal movement were reviewed in detail with the patient. Please refer to After Visit Summary for other counseling recommendations.  Return in about 2 weeks (around 05/21/2021) for ROB with any provider.   Warner Mccreedy, MD, MPH OB Fellow, Faculty Practice

## 2021-05-07 NOTE — Progress Notes (Signed)
tdap

## 2021-05-08 ENCOUNTER — Encounter: Payer: Self-pay | Admitting: *Deleted

## 2021-05-08 ENCOUNTER — Telehealth: Payer: Self-pay | Admitting: *Deleted

## 2021-05-08 LAB — CBC
Hematocrit: 29.8 % — ABNORMAL LOW (ref 34.0–46.6)
Hemoglobin: 9.7 g/dL — ABNORMAL LOW (ref 11.1–15.9)
MCH: 22.4 pg — ABNORMAL LOW (ref 26.6–33.0)
MCHC: 32.6 g/dL (ref 31.5–35.7)
MCV: 69 fL — ABNORMAL LOW (ref 79–97)
Platelets: 206 10*3/uL (ref 150–450)
RBC: 4.34 x10E6/uL (ref 3.77–5.28)
RDW: 14.5 % (ref 11.7–15.4)
WBC: 7.8 10*3/uL (ref 3.4–10.8)

## 2021-05-08 LAB — HIV ANTIBODY (ROUTINE TESTING W REFLEX): HIV Screen 4th Generation wRfx: NONREACTIVE

## 2021-05-08 LAB — RPR: RPR Ser Ql: NONREACTIVE

## 2021-05-08 LAB — GLUCOSE TOLERANCE, 2 HOURS W/ 1HR
Glucose, 1 hour: 113 mg/dL (ref 65–179)
Glucose, 2 hour: 75 mg/dL (ref 65–152)
Glucose, Fasting: 67 mg/dL (ref 65–91)

## 2021-05-08 NOTE — Telephone Encounter (Signed)
Call to patient with surgery scheduling information. No answer and Voice mail not set up. Unable to leave message. Letter sent via mail and visible on My Chart.

## 2021-05-11 ENCOUNTER — Other Ambulatory Visit: Payer: Self-pay | Admitting: Family Medicine

## 2021-05-11 DIAGNOSIS — Z3480 Encounter for supervision of other normal pregnancy, unspecified trimester: Secondary | ICD-10-CM

## 2021-05-13 ENCOUNTER — Telehealth: Payer: Self-pay

## 2021-05-13 LAB — CULTURE, FUNGUS WITHOUT SMEAR

## 2021-05-13 NOTE — Telephone Encounter (Signed)
-----   Message from Anuka Das, MD sent at 05/11/2021  1:45 AM EDT ----- Regarding: Request to reach out to patient with results Hi, Could you call this patient and let her know that her HIV and syphilis tests were negative and that her GTT is normal.  She is anemic with hemoglobin of 9.7, so I recommend she starts on iron every other day. Could you confirm her pharmacy and let me know and I can send in a prescription?  Thanks Dr.Das ----- Message ----- From: Interface, [facility] Lab Results In Sent: 05/08/2021   4:36 AM EDT To: Anuka Das, MD   

## 2021-05-13 NOTE — Telephone Encounter (Signed)
Called Pt a 2nd time still no answer, Pt has My Chart,will send message.

## 2021-05-13 NOTE — Telephone Encounter (Signed)
-----   Message from Warner Mccreedy, MD sent at 05/11/2021  1:45 AM EDT ----- Regarding: Request to reach out to patient with results Hi, Could you call this patient and let her know that her HIV and syphilis tests were negative and that her GTT is normal.  She is anemic with hemoglobin of 9.7, so I recommend she starts on iron every other day. Could you confirm her pharmacy and let me know and I can send in a prescription?  Thanks Dr.Das ----- Message ----- From: Interface, [facility] Lab Results In Sent: 05/08/2021   4:36 AM EDT To: Warner Mccreedy, MD

## 2021-05-13 NOTE — Telephone Encounter (Signed)
Called Pt to inform of test results, no answer, VM has not been set up yet, advised to call again later.

## 2021-05-14 ENCOUNTER — Other Ambulatory Visit: Payer: Self-pay | Admitting: Family Medicine

## 2021-05-14 MED ORDER — FERROUS SULFATE 325 (65 FE) MG PO TABS: 325.0000 mg | ORAL_TABLET | ORAL | 2 refills | Status: DC

## 2021-05-14 NOTE — Progress Notes (Signed)
IV iron sent to patient's pharmacy on file since unable to reach patient on phone.   Mychart message sent to patient with instructions.  Warner Mccreedy, MD, MPH OB Fellow, Faculty Practice

## 2021-05-21 ENCOUNTER — Encounter: Payer: Self-pay | Admitting: Family Medicine

## 2021-05-21 ENCOUNTER — Ambulatory Visit (INDEPENDENT_AMBULATORY_CARE_PROVIDER_SITE_OTHER): Payer: Medicaid Other | Admitting: Family Medicine

## 2021-05-21 ENCOUNTER — Other Ambulatory Visit: Payer: Self-pay

## 2021-05-21 VITALS — BP 102/66 | HR 91 | Wt 216.8 lb

## 2021-05-21 DIAGNOSIS — R8271 Bacteriuria: Secondary | ICD-10-CM

## 2021-05-21 DIAGNOSIS — Z3480 Encounter for supervision of other normal pregnancy, unspecified trimester: Secondary | ICD-10-CM

## 2021-05-21 DIAGNOSIS — Z3A30 30 weeks gestation of pregnancy: Secondary | ICD-10-CM

## 2021-05-21 DIAGNOSIS — O09299 Supervision of pregnancy with other poor reproductive or obstetric history, unspecified trimester: Secondary | ICD-10-CM

## 2021-05-21 DIAGNOSIS — B373 Candidiasis of vulva and vagina: Secondary | ICD-10-CM

## 2021-05-21 DIAGNOSIS — B3731 Acute candidiasis of vulva and vagina: Secondary | ICD-10-CM

## 2021-05-21 DIAGNOSIS — Z98891 History of uterine scar from previous surgery: Secondary | ICD-10-CM

## 2021-05-21 DIAGNOSIS — Z0289 Encounter for other administrative examinations: Secondary | ICD-10-CM

## 2021-05-21 NOTE — Progress Notes (Signed)
   Subjective:  Sunni Richardson is a 25 y.o. G3P2002 at [redacted]w[redacted]d being seen today for ongoing prenatal care.  She is currently monitored for the following issues for this high-risk pregnancy and has Supervision of other normal pregnancy, antepartum; History of 2 cesarean sections; Hx of preeclampsia, prior pregnancy, currently pregnant; Group B streptococcal bacteriuria; Genetic carrier; and Vaginal yeast infection on their problem list.  Patient reports no complaints.  Contractions: Not present. Vag. Bleeding: None.  Movement: Present. Denies leaking of fluid.   The following portions of the patient's history were reviewed and updated as appropriate: allergies, current medications, past family history, past medical history, past social history, past surgical history and problem list. Problem list updated.  Objective:   Vitals:   05/21/21 1130  BP: 102/66  Pulse: 91  Weight: 216 lb 12.8 oz (98.3 kg)    Fetal Status: Fetal Heart Rate (bpm): 141   Movement: Present     General:  Alert, oriented and cooperative. Patient is in no acute distress.  Skin: Skin is warm and dry. No rash noted.   Cardiovascular: Normal heart rate noted  Respiratory: Normal respiratory effort, no problems with respiration noted  Abdomen: Soft, gravid, appropriate for gestational age. Pain/Pressure: Present     Pelvic: Vag. Bleeding: None     Cervical exam deferred        Extremities: Normal range of motion.  Edema: None  Mental Status: Normal mood and affect. Normal behavior. Normal judgment and thought content.   Urinalysis:      Assessment and Plan:  Pregnancy: G3P2002 at [redacted]w[redacted]d  1. Supervision of other normal pregnancy, antepartum BP and FHR normal  2. History of 2 cesarean sections TOLAC if spontaneous labor, otherwise RCS  3. Hx of preeclampsia, prior pregnancy, currently pregnant BP normal  4. Group B streptococcal bacteriuria   5. Vaginal yeast infection After discussion sounds like it might be  more consistent with normal leukorrhea of pregnancy. Discussed normal hygiene.   Preterm labor symptoms and general obstetric precautions including but not limited to vaginal bleeding, contractions, leaking of fluid and fetal movement were reviewed in detail with the patient. Please refer to After Visit Summary for other counseling recommendations.  Return in 2 weeks (on 06/04/2021) for [facility], ob visit.   Venora Maples, MD

## 2021-05-21 NOTE — Patient Instructions (Signed)

## 2021-06-04 ENCOUNTER — Other Ambulatory Visit: Payer: Self-pay | Admitting: Family Medicine

## 2021-06-04 DIAGNOSIS — Z98891 History of uterine scar from previous surgery: Secondary | ICD-10-CM

## 2021-06-04 DIAGNOSIS — B951 Streptococcus, group B, as the cause of diseases classified elsewhere: Secondary | ICD-10-CM

## 2021-06-09 ENCOUNTER — Inpatient Hospital Stay (HOSPITAL_COMMUNITY)
Admission: AD | Admit: 2021-06-09 | Discharge: 2021-06-09 | Disposition: A | Discharge status: Home / Self Care | Payer: Medicaid Other | Attending: Obstetrics & Gynecology | Admitting: Obstetrics & Gynecology

## 2021-06-09 ENCOUNTER — Other Ambulatory Visit: Payer: Self-pay

## 2021-06-09 ENCOUNTER — Encounter (HOSPITAL_COMMUNITY): Payer: Self-pay | Admitting: Obstetrics & Gynecology

## 2021-06-09 DIAGNOSIS — O368190 Decreased fetal movements, unspecified trimester, not applicable or unspecified: Secondary | ICD-10-CM | POA: Insufficient documentation

## 2021-06-09 DIAGNOSIS — O99333 Smoking (tobacco) complicating pregnancy, third trimester: Secondary | ICD-10-CM | POA: Insufficient documentation

## 2021-06-09 DIAGNOSIS — F17290 Nicotine dependence, other tobacco product, uncomplicated: Secondary | ICD-10-CM | POA: Insufficient documentation

## 2021-06-09 DIAGNOSIS — O98813 Other maternal infectious and parasitic diseases complicating pregnancy, third trimester: Secondary | ICD-10-CM | POA: Diagnosis not present

## 2021-06-09 DIAGNOSIS — B373 Candidiasis of vulva and vagina: Secondary | ICD-10-CM | POA: Diagnosis not present

## 2021-06-09 DIAGNOSIS — Z7982 Long term (current) use of aspirin: Secondary | ICD-10-CM | POA: Insufficient documentation

## 2021-06-09 DIAGNOSIS — O26893 Other specified pregnancy related conditions, third trimester: Secondary | ICD-10-CM | POA: Diagnosis present

## 2021-06-09 DIAGNOSIS — B3731 Acute candidiasis of vulva and vagina: Secondary | ICD-10-CM

## 2021-06-09 DIAGNOSIS — Z3A33 33 weeks gestation of pregnancy: Secondary | ICD-10-CM | POA: Insufficient documentation

## 2021-06-09 DIAGNOSIS — Z880 Allergy status to penicillin: Secondary | ICD-10-CM | POA: Insufficient documentation

## 2021-06-09 LAB — OB RESULTS CONSOLE GBS: GBS: POSITIVE

## 2021-06-09 LAB — OB RESULTS CONSOLE GC/CHLAMYDIA: Gonorrhea: NEGATIVE

## 2021-06-09 MED ORDER — TERCONAZOLE 0.4 % VA CREA: 1.0000 | TOPICAL_CREAM | Freq: Every day | VAGINAL | 0 refills | Status: DC

## 2021-06-09 NOTE — MAU Note (Signed)
Pt in lobby talking with friend, states she needs a minute

## 2021-06-09 NOTE — Discharge Instructions (Signed)
-  make appt at [facility]; call them tomorrow!

## 2021-06-09 NOTE — MAU Note (Signed)
Pt reports to mau with c/o no fetal movement since last night.  FHR 150 during triage.  Pt also reports yellow dc and vag itching that started Friday.  Denies ctx or LOF.

## 2021-06-09 NOTE — MAU Provider Note (Signed)
Patient Sarah Copeland is a 25 y.o. G3P2002 at [redacted]w[redacted]d here with complaints of decreased fetal movement that started today. Patient states that baby wasn't moving "as much as she normally moves".   She denies vaginal bleeding, contractions, fever, cough, dsyura but she does think she has another yeast infection.   She went to her mom's house for dinner today and then notice that the baby was not moving as much. She came in to be seen for decreased fetal movements and vaginal itching.  History     CSN: [id number]  Arrival date and time: 06/09/21 1817   Event Date/Time   First Provider Initiated Contact with Patient 06/09/21 1908      Chief Complaint  Patient presents with   Decreased Fetal Movement   Vaginal Itching   Vaginal Discharge   Vaginal Itching The patient's primary symptoms include vaginal discharge. The current episode started yesterday. The problem occurs constantly. The problem has been gradually worsening. Pertinent negatives include no back pain, chills, constipation, dysuria, fever, painful intercourse, sore throat or urgency. There has been no bleeding.  Vaginal Discharge The patient's primary symptoms include vaginal discharge. The current episode started yesterday. Pertinent negatives include no back pain, chills, constipation, dysuria, fever, painful intercourse, sore throat or urgency.   OB History     Gravida  3   Para  2   Term  2   Preterm      AB      Living  2      SAB      IAB      Ectopic      Multiple      Live Births  2        Obstetric Comments  1- Pre E 2- wasn't dilating         Past Medical History:  Diagnosis Date   Anemia    Anxiety    Depression    stressed, going through custody battle   Headache    Kidney stones    Medical history non-contributory    Preeclampsia 2018   Vaginal Pap smear, abnormal    f/u [facility]    Past Surgical History:  Procedure Laterality Date   CESAREAN SECTION      Family History   Problem Relation Age of Onset   Depression Mother    Hypertension Mother    Bipolar disorder Mother    Schizophrenia Mother    Kidney disease Father        85- kidney failure    Social History   Tobacco Use   Smoking status: Every Day    Types: Cigars   Smokeless tobacco: Never   Tobacco comments:    black and mild  Vaping Use   Vaping Use: Never used  Substance Use Topics   Alcohol use: Never   Drug use: Never    Allergies:  Allergies  Allergen Reactions   Amoxicillin Anaphylaxis    Medications Prior to Admission  Medication Sig Dispense Refill Last Dose   aspirin 81 MG chewable tablet Chew 1 tablet (81 mg total) by mouth daily. 30 tablet 5    Blood Pressure Monitoring (BLOOD PRESSURE KIT) DEVI 1 Device by Does not apply route once a week. (Patient not taking: No sig reported) 1 each 0    ferrous sulfate 325 (65 FE) MG tablet Take 1 tablet (325 mg total) by mouth every other day. 45 tablet 2    Misc. Devices (GOJJI WEIGHT SCALE) MISC 1 Units by  Does not apply route daily. (Patient not taking: No sig reported) 1 each 0    prenatal vitamin w/FE, FA (PRENATAL 1 + 1) 27-1 MG TABS tablet Take 1 tablet by mouth daily at 12 noon. 30 tablet 11     Review of Systems  Constitutional: Negative.  Negative for chills and fever.  HENT: Negative.  Negative for sore throat.   Respiratory: Negative.    Cardiovascular: Negative.   Gastrointestinal: Negative.  Negative for constipation.  Genitourinary:  Positive for vaginal discharge. Negative for dysuria and urgency.  Musculoskeletal:  Negative for back pain.  Neurological: Negative.   Hematological: Negative.   Psychiatric/Behavioral: Negative.    Physical Exam   Blood pressure 125/77, pulse 81, temperature 98.3 F (36.8 C), temperature source Oral, resp. rate 17, last menstrual period 10/20/2020, SpO2 98 %.  Physical Exam Constitutional:      Appearance: Normal appearance.  HENT:     Head: Normocephalic.   Cardiovascular:     Rate and Rhythm: Normal rate.  Pulmonary:     Effort: Pulmonary effort is normal.  Abdominal:     Palpations: Abdomen is soft.  Genitourinary:    General: Normal vulva.     Vagina: Vaginal discharge present.     Comments: NEFG; patient copious white clumpy discharge in the vagina with reddened vaginal walls Musculoskeletal:        General: Normal range of motion.  Skin:    General: Skin is warm.  Neurological:     Mental Status: She is alert.    MAU Course  Procedures  MDM -patient felt strong fetal movements while in MAU  -NST: 130 bpm, mod var, present acel, no decels, no conractions -patient states that she is trying to switch to [facility] because she cannot make her appts at [facility] -patient has not had care in over a month; I urged her to call [facility] and set up her appts as well as update [place] on her status so that we can cancel her c/section if necessary -patient   Assessment and Plan   1. Vaginal yeast infection    -discharge home with RX for Terazole -GC CT pending -reminded to call [facility] to set up appt for follow up [place] 06/09/2021, 7:08 PM

## 2021-06-10 ENCOUNTER — Encounter: Payer: Medicaid Other | Admitting: Student

## 2021-06-10 LAB — GC/CHLAMYDIA PROBE AMP (~~LOC~~) NOT AT ARMC
Chlamydia: NEGATIVE
Comment: NEGATIVE
Comment: NORMAL
Neisseria Gonorrhea: NEGATIVE

## 2021-06-17 ENCOUNTER — Other Ambulatory Visit: Payer: Self-pay

## 2021-06-17 ENCOUNTER — Inpatient Hospital Stay (HOSPITAL_COMMUNITY)
Admission: AD | Admit: 2021-06-17 | Discharge: 2021-06-17 | Disposition: A | Discharge status: Home / Self Care | Payer: Medicaid Other | Attending: Family Medicine | Admitting: Family Medicine

## 2021-06-17 ENCOUNTER — Encounter (HOSPITAL_COMMUNITY): Payer: Self-pay | Admitting: Family Medicine

## 2021-06-17 DIAGNOSIS — O99613 Diseases of the digestive system complicating pregnancy, third trimester: Secondary | ICD-10-CM | POA: Insufficient documentation

## 2021-06-17 DIAGNOSIS — F17290 Nicotine dependence, other tobacco product, uncomplicated: Secondary | ICD-10-CM | POA: Insufficient documentation

## 2021-06-17 DIAGNOSIS — O99333 Smoking (tobacco) complicating pregnancy, third trimester: Secondary | ICD-10-CM | POA: Insufficient documentation

## 2021-06-17 DIAGNOSIS — O212 Late vomiting of pregnancy: Secondary | ICD-10-CM | POA: Diagnosis present

## 2021-06-17 DIAGNOSIS — Z3A34 34 weeks gestation of pregnancy: Secondary | ICD-10-CM | POA: Diagnosis not present

## 2021-06-17 DIAGNOSIS — K219 Gastro-esophageal reflux disease without esophagitis: Secondary | ICD-10-CM | POA: Diagnosis not present

## 2021-06-17 LAB — URINALYSIS, ROUTINE W REFLEX MICROSCOPIC
Bacteria, UA: NONE SEEN
Bilirubin Urine: NEGATIVE
Glucose, UA: NEGATIVE mg/dL
Hgb urine dipstick: NEGATIVE
Ketones, ur: 5 mg/dL — AB
Leukocytes,Ua: NEGATIVE
Nitrite: NEGATIVE
Protein, ur: 30 mg/dL — AB
Specific Gravity, Urine: 1.026 (ref 1.005–1.030)
pH: 5 (ref 5.0–8.0)

## 2021-06-17 MED ORDER — ALUM & MAG HYDROXIDE-SIMETH 200-200-20 MG/5ML PO SUSP
30.0000 mL | Freq: Once | ORAL | Status: AC
  Administered 2021-06-17: 30 mL via ORAL
  Filled 2021-06-17: qty 30

## 2021-06-17 MED ORDER — FAMOTIDINE 20 MG PO TABS: 20.0000 mg | ORAL_TABLET | Freq: Two times a day (BID) | ORAL | 2 refills | Status: DC

## 2021-06-17 MED ORDER — FAMOTIDINE 20 MG PO TABS
20.0000 mg | ORAL_TABLET | Freq: Once | ORAL | Status: AC
  Administered 2021-06-17: 20 mg via ORAL
  Filled 2021-06-17: qty 1

## 2021-06-17 MED ORDER — LIDOCAINE VISCOUS HCL 2 % MT SOLN
15.0000 mL | Freq: Once | OROMUCOSAL | Status: AC
  Administered 2021-06-17: 15 mL via ORAL
  Filled 2021-06-17: qty 15

## 2021-06-17 NOTE — MAU Provider Note (Signed)
History     CSN: [id number]  Arrival date and time: 06/17/21 1202   Event Date/Time   First Provider Initiated Contact with Patient 06/17/21 1322      Chief Complaint  Patient presents with   Emesis   HPI Sarah Copeland is a 25 y.o. G3P2002 at 78w3dwho presents with heartburn and vomiting. She states she noticed heartburn after she ate at 1800. She then vomited at 2100, 2300, 0000 and 0500. She reports she is still feeling nauseous due to the heartburn. She has tried TNurse, adultand ice with no relief. She denies any abdominal pain, vaginal bleeding or discharge. Reports normal fetal movement.   OB History     Gravida  3   Para  2   Term  2   Preterm      AB      Living  2      SAB      IAB      Ectopic      Multiple      Live Births  2        Obstetric Comments  1- Pre E 2- wasn't dilating         Past Medical History:  Diagnosis Date   Anemia    Anxiety    Depression    stressed, going through custody battle   Headache    Kidney stones    Medical history non-contributory    Preeclampsia 2018   Vaginal Pap smear, abnormal    f/u [facility]    Past Surgical History:  Procedure Laterality Date   CESAREAN SECTION      Family History  Problem Relation Age of Onset   Depression Mother    Hypertension Mother    Bipolar disorder Mother    Schizophrenia Mother    Kidney disease Father        143 kidney failure    Social History   Tobacco Use   Smoking status: Every Day    Types: Cigars   Smokeless tobacco: Never   Tobacco comments:    black and mild  Vaping Use   Vaping Use: Never used  Substance Use Topics   Alcohol use: Never   Drug use: Never    Allergies:  Allergies  Allergen Reactions   Amoxicillin Anaphylaxis    Medications Prior to Admission  Medication Sig Dispense Refill Last Dose   aspirin 81 MG chewable tablet Chew 1 tablet (81 mg total) by mouth daily. 30 tablet 5    Blood Pressure Monitoring (BLOOD PRESSURE KIT)  DEVI 1 Device by Does not apply route once a week. (Patient not taking: No sig reported) 1 each 0    ferrous sulfate 325 (65 FE) MG tablet Take 1 tablet (325 mg total) by mouth every other day. 45 tablet 2    Misc. Devices (GOJJI WEIGHT SCALE) MISC 1 Units by Does not apply route daily. (Patient not taking: No sig reported) 1 each 0    prenatal vitamin w/FE, FA (PRENATAL 1 + 1) 27-1 MG TABS tablet Take 1 tablet by mouth daily at 12 noon. 30 tablet 11    terconazole (TERAZOL 7) 0.4 % vaginal cream Place 1 applicator vaginally at bedtime. 45 g 0     Review of Systems  Constitutional: Negative.  Negative for fatigue and fever.  HENT: Negative.    Respiratory: Negative.  Negative for shortness of breath.   Cardiovascular: Negative.  Negative for chest pain.  Gastrointestinal:  Positive for nausea  and vomiting. Negative for abdominal pain, constipation and diarrhea.  Genitourinary: Negative.  Negative for dysuria, vaginal bleeding and vaginal discharge.  Neurological: Negative.  Negative for dizziness and headaches.  Physical Exam   Blood pressure 138/69, pulse 87, temperature 97.9 F (36.6 C), resp. rate 18, height _0  (1.702 m), weight 100.7 kg, last menstrual period 10/20/2020.  Physical Exam Vitals and nursing note reviewed.  Constitutional:      General: She is not in acute distress.    Appearance: She is well-developed.  HENT:     Head: Normocephalic.  Eyes:     Pupils: Pupils are equal, round, and reactive to light.  Cardiovascular:     Rate and Rhythm: Normal rate and regular rhythm.     Heart sounds: Normal heart sounds.  Pulmonary:     Effort: Pulmonary effort is normal. No respiratory distress.     Breath sounds: Normal breath sounds.  Abdominal:     General: Bowel sounds are normal. There is no distension.     Palpations: Abdomen is soft.     Tenderness: There is no abdominal tenderness.  Skin:    General: Skin is warm and dry.  Neurological:     Mental Status: She  is alert and oriented to person, place, and time.  Psychiatric:        Mood and Affect: Mood normal.        Behavior: Behavior normal.        Thought Content: Thought content normal.        Judgment: Judgment normal.    MAU Course  Procedures Results for orders placed or performed during the hospital encounter of 06/17/21 (from the past 24 hour(s))  Urinalysis, Routine w reflex microscopic Urine, Clean Catch     Status: Abnormal   Collection Time: 06/17/21 12:38 PM  Result Value Ref Range   Color, Urine AMBER (A) YELLOW   APPearance HAZY (A) CLEAR   Specific Gravity, Urine 1.026 1.005 - 1.030   pH 5.0 5.0 - 8.0   Glucose, UA NEGATIVE NEGATIVE mg/dL   Hgb urine dipstick NEGATIVE NEGATIVE   Bilirubin Urine NEGATIVE NEGATIVE   Ketones, ur 5 (A) NEGATIVE mg/dL   Protein, ur 30 (A) NEGATIVE mg/dL   Nitrite NEGATIVE NEGATIVE   Leukocytes,Ua NEGATIVE NEGATIVE   RBC / HPF 0-5 0 - 5 RBC/hpf   WBC, UA 0-5 0 - 5 WBC/hpf   Bacteria, UA NONE SEEN NONE SEEN   Squamous Epithelial / LPF 6-10 0 - 5   Mucus PRESENT     MDM UA GI Cocktail Pepcid PO Patient reports relief of symptoms  Assessment and Plan   1. Gastroesophageal reflux disease, unspecified whether esophagitis present   2. [redacted] weeks gestation of pregnancy    -Discharge home in stable condition -Rx for pepcid sent to patient's pharmacy -Third trimester precautions discussed -Patient advised to follow-up with OB as scheduled for prenatal care -Patient may return to MAU as needed or if her condition were to change or worsen   [place] 06/17/2021, 1:22 PM

## 2021-06-17 NOTE — MAU Note (Signed)
Pt reports she was up all night vomiting from reflux. Able to keep water and ice down. Denies any abd pain. Good fetal movemement felt.

## 2021-06-17 NOTE — MAU Note (Signed)
Pt provided discharge instructions, verbalized understanding and had no questions. Pt states she is feeling better.

## 2021-06-17 NOTE — Discharge Instructions (Signed)

## 2021-06-17 NOTE — MAU Note (Signed)
25 YO F G3P2002 at [redacted]W[redacted]D presents to MAU with complaints of heartburn with emesis overnight. Initial assessment WNL. Positive fetal movement with no bleeding/LOF. Patient complains of Braxton-Hicks ctx occ. Patient stated missing last prenatal appointment and is looking for a new provider. Prior history of preeclampsia with previous pregnancy, c- section X2. Patient states she does not have custody of children.

## 2021-07-10 ENCOUNTER — Encounter (HOSPITAL_COMMUNITY): Payer: Self-pay

## 2021-07-10 NOTE — Patient Instructions (Signed)
Kali Deadwyler  07/10/2021   Your procedure is scheduled on:  07/19/2021  Arrive at 0730 at Entrance C on [address] at [facility]  and [employer]. You are invited to use the FREE valet parking or use the Visitor's parking deck.  Pick up the phone at the desk and dial [phone].  Call this number if you have problems the morning of surgery: [phone]  Remember:   Do not eat food:(After Midnight) Desps de medianoche.  Do not drink clear liquids: (After Midnight) Desps de medianoche.  Take these medicines the morning of surgery with A SIP OF WATER:  none   Do not wear jewelry, make-up or nail polish.  Do not wear lotions, powders, or perfumes. Do not wear deodorant.  Do not shave 48 hours prior to surgery.  Do not bring valuables to the hospital.  [facility] is not   responsible for any belongings or valuables brought to the hospital.  Contacts, dentures or bridgework may not be worn into surgery.  Leave suitcase in the car. After surgery it may be brought to your room.  For patients admitted to the hospital, checkout time is 11:00 AM the day of              discharge.      Please read over the following fact sheets that you were given:     Preparing for Surgery

## 2021-07-14 ENCOUNTER — Inpatient Hospital Stay (HOSPITAL_COMMUNITY)
Admission: AD | Admit: 2021-07-14 | Discharge: 2021-07-14 | Disposition: A | Discharge status: Home / Self Care | Payer: Medicaid Other | Attending: Obstetrics & Gynecology | Admitting: Obstetrics & Gynecology

## 2021-07-14 ENCOUNTER — Encounter (HOSPITAL_COMMUNITY): Payer: Self-pay | Admitting: Obstetrics & Gynecology

## 2021-07-14 ENCOUNTER — Other Ambulatory Visit: Payer: Self-pay

## 2021-07-14 DIAGNOSIS — Z3A38 38 weeks gestation of pregnancy: Secondary | ICD-10-CM | POA: Diagnosis not present

## 2021-07-14 DIAGNOSIS — Z87891 Personal history of nicotine dependence: Secondary | ICD-10-CM | POA: Diagnosis not present

## 2021-07-14 DIAGNOSIS — O26893 Other specified pregnancy related conditions, third trimester: Secondary | ICD-10-CM | POA: Diagnosis not present

## 2021-07-14 DIAGNOSIS — R519 Headache, unspecified: Secondary | ICD-10-CM | POA: Diagnosis not present

## 2021-07-14 LAB — URINALYSIS, ROUTINE W REFLEX MICROSCOPIC
Bacteria, UA: NONE SEEN
Bilirubin Urine: NEGATIVE
Glucose, UA: NEGATIVE mg/dL
Hgb urine dipstick: NEGATIVE
Ketones, ur: 5 mg/dL — AB
Leukocytes,Ua: NEGATIVE
Nitrite: NEGATIVE
Protein, ur: 30 mg/dL — AB
Specific Gravity, Urine: 1.029 (ref 1.005–1.030)
pH: 6 (ref 5.0–8.0)

## 2021-07-14 MED ORDER — ACETAMINOPHEN 500 MG PO TABS
1000.0000 mg | ORAL_TABLET | Freq: Once | ORAL | Status: AC
  Administered 2021-07-14: 1000 mg via ORAL
  Filled 2021-07-14: qty 2

## 2021-07-14 MED ORDER — CYCLOBENZAPRINE HCL 7.5 MG PO TABS: 7.5000 mg | ORAL_TABLET | Freq: Three times a day (TID) | ORAL | 0 refills | Status: DC | PRN

## 2021-07-14 MED ORDER — METOCLOPRAMIDE HCL 10 MG PO TABS: 10.0000 mg | ORAL_TABLET | Freq: Three times a day (TID) | ORAL | 0 refills | Status: DC | PRN

## 2021-07-14 NOTE — Discharge Instructions (Signed)
For prevention of migraines in pregnancy: -Magnesium, 400mg by mouth, once daily -Vitamin B2, 400mg by mouth, once daily  For treatment of migraines in pregnancy: -take medication at the first sign of the pain of a headache, or the first sign of your aura -start with 1000mg Tylenol (do not exceed 4000mg of Tylenol in 24hrs), with or without Reglan 10mg -if no relief after 1-2hours, can take Flexeril 10mg     

## 2021-07-14 NOTE — MAU Note (Signed)
Sarah Copeland is a 25 y.o. at [redacted]w[redacted]d here in MAU reporting: headache since Friday afternoon. States she took some tylenol with minimal relief. Pain has persisted.   Onset of complaint: ongoing  Pain score: 8/10  Vitals:   07/14/21 1310  BP: 124/65  Pulse: 74  Resp: 18  Temp: 98.8 F (37.1 C)  SpO2: 98%     FHT:135  Lab orders placed from triage: UA

## 2021-07-14 NOTE — MAU Provider Note (Signed)
History     CSN: [id number]  Arrival date and time: 07/14/21 1257   Event Date/Time   First Provider Initiated Contact with Patient 07/14/21 1340      Chief Complaint  Patient presents with   Headache   HPI Sarah Copeland is a 25 y.o. G3P2002 at [redacted]w[redacted]d who presents with headache. Symptoms started Friday. Reports constant frontal headache that she describes as aching. Worse with lights & sound. Rates pain 8/10. Took tylenol on Friday with some relief. No treatment since then.  Denies visual disturbance, epigastric pain, or hypertension.  Denies contractions, LOF, or vaginal bleeding.  Reports good fetal movement.   OB History     Gravida  3   Para  2   Term  2   Preterm      AB      Living  2      SAB      IAB      Ectopic      Multiple      Live Births  2        Obstetric Comments  1- Pre E 2- wasn't dilating         Past Medical History:  Diagnosis Date   Anemia    Anxiety    Depression    stressed, going through custody battle   Headache    Kidney stones    Medical history non-contributory    Preeclampsia 2018   Vaginal Pap smear, abnormal    f/u [facility]    Past Surgical History:  Procedure Laterality Date   CESAREAN SECTION      Family History  Problem Relation Age of Onset   Depression Mother    Hypertension Mother    Bipolar disorder Mother    Schizophrenia Mother    Kidney disease Father        43- kidney failure    Social History   Tobacco Use   Smoking status: Former    Types: Cigars    Quit date: 06/13/2021    Years since quitting: 0.0   Smokeless tobacco: Never   Tobacco comments:    black and mild  Vaping Use   Vaping Use: Never used  Substance Use Topics   Alcohol use: Never   Drug use: Never    Allergies:  Allergies  Allergen Reactions   Amoxicillin Anaphylaxis    Medications Prior to Admission  Medication Sig Dispense Refill Last Dose   aspirin 81 MG chewable tablet Chew 1 tablet (81 mg total) by  mouth daily. 30 tablet 5 Past Week   ferrous sulfate 325 (65 FE) MG tablet Take 1 tablet (325 mg total) by mouth every other day. 45 tablet 2 07/13/2021   prenatal vitamin w/FE, FA (PRENATAL 1 + 1) 27-1 MG TABS tablet Take 1 tablet by mouth daily at 12 noon. 30 tablet 11 07/14/2021   Blood Pressure Monitoring (BLOOD PRESSURE KIT) DEVI 1 Device by Does not apply route once a week. (Patient not taking: No sig reported) 1 each 0    famotidine (PEPCID) 20 MG tablet Take 1 tablet (20 mg total) by mouth 2 (two) times daily. 60 tablet 2 More than a month   Misc. Devices (GOJJI WEIGHT SCALE) MISC 1 Units by Does not apply route daily. (Patient not taking: No sig reported) 1 each 0    terconazole (TERAZOL 7) 0.4 % vaginal cream Place 1 applicator vaginally at bedtime. 45 g 0     Review of Systems  Constitutional:  Negative.   Eyes:  Positive for photophobia. Negative for visual disturbance.  Gastrointestinal: Negative.   Genitourinary: Negative.   Neurological:  Positive for headaches.  Physical Exam   Blood pressure 126/77, pulse 90, temperature 98.8 F (37.1 C), temperature source Oral, resp. rate 18, height $RemoveBe'5\' 7"'[id number]$  (1.702 m), weight 99.8 kg, last menstrual period 10/20/2020, SpO2 98 %.  Physical Exam Vitals and nursing note reviewed.  Constitutional:      Appearance: She is well-developed. She is not ill-appearing.  HENT:     Head: Normocephalic and atraumatic.  Eyes:     General: No scleral icterus.    Extraocular Movements: Extraocular movements intact.     Pupils: Pupils are equal, round, and reactive to light.  Pulmonary:     Effort: Pulmonary effort is normal. No respiratory distress.  Abdominal:     Palpations: Abdomen is soft.  Skin:    General: Skin is warm and dry.  Neurological:     Mental Status: She is alert.     Sensory: No sensory deficit.  Psychiatric:        Mood and Affect: Mood normal.        Behavior: Behavior normal.   NST:  Baseline: 130 bpm, Variability: Good  {> 6 bpm), Accelerations: Reactive, and Decelerations: Absent  MAU Course  Procedures Results for orders placed or performed during the hospital encounter of 07/14/21 (from the past 24 hour(s))  Urinalysis, Routine w reflex microscopic Urine, Clean Catch     Status: Abnormal   Collection Time: 07/14/21  1:31 PM  Result Value Ref Range   Color, Urine AMBER (A) YELLOW   APPearance CLEAR CLEAR   Specific Gravity, Urine 1.029 1.005 - 1.030   pH 6.0 5.0 - 8.0   Glucose, UA NEGATIVE NEGATIVE mg/dL   Hgb urine dipstick NEGATIVE NEGATIVE   Bilirubin Urine NEGATIVE NEGATIVE   Ketones, ur 5 (A) NEGATIVE mg/dL   Protein, ur 30 (A) NEGATIVE mg/dL   Nitrite NEGATIVE NEGATIVE   Leukocytes,Ua NEGATIVE NEGATIVE   RBC / HPF 0-5 0 - 5 RBC/hpf   WBC, UA 0-5 0 - 5 WBC/hpf   Bacteria, UA NONE SEEN NONE SEEN   Squamous Epithelial / LPF 0-5 0 - 5   Mucus PRESENT     MDM Patient presents with headache. She is normotensive. Drove self here & doesn't have ride so avoided treatment with sedating effects. Given tylenol 1 gm, caffeinated drink, & oral hydration.  Patient reports headache down to 5/10 and continuing to improve. Discussed treatment of headache at home. Patient will pick up Rx when she leaves to take medication when she gets home.   Assessment and Plan   1. Pregnancy headache in third trimester   2. [redacted] weeks gestation of pregnancy    -Rx flexeril & reglan -reviewed reasons to return to [place] 07/14/2021, 1:40 PM

## 2021-07-15 ENCOUNTER — Encounter (HOSPITAL_COMMUNITY): Payer: Self-pay | Admitting: Family Medicine

## 2021-07-17 ENCOUNTER — Other Ambulatory Visit: Payer: Self-pay

## 2021-07-17 ENCOUNTER — Encounter (HOSPITAL_COMMUNITY)
Admission: RE | Admit: 2021-07-17 | Discharge: 2021-07-17 | Disposition: A | Discharge status: Home / Self Care | Payer: Medicaid Other | Source: Ambulatory Visit | Attending: Family Medicine | Admitting: Family Medicine

## 2021-07-17 ENCOUNTER — Other Ambulatory Visit: Payer: Self-pay | Admitting: Family Medicine

## 2021-07-17 DIAGNOSIS — Z20822 Contact with and (suspected) exposure to covid-19: Secondary | ICD-10-CM | POA: Diagnosis not present

## 2021-07-17 DIAGNOSIS — Z01812 Encounter for preprocedural laboratory examination: Secondary | ICD-10-CM | POA: Diagnosis present

## 2021-07-17 LAB — TYPE AND SCREEN
ABO/RH(D): O POS
Antibody Screen: NEGATIVE

## 2021-07-17 LAB — RAPID HIV SCREEN (HIV 1/2 AB+AG)
HIV 1/2 Antibodies: NONREACTIVE
HIV-1 P24 Antigen - HIV24: NONREACTIVE

## 2021-07-17 LAB — SARS CORONAVIRUS 2 (TAT 6-24 HRS): SARS Coronavirus 2: NEGATIVE

## 2021-07-17 LAB — CBC
HCT: 32.6 % — ABNORMAL LOW (ref 36.0–46.0)
Hemoglobin: 10.4 g/dL — ABNORMAL LOW (ref 12.0–15.0)
MCH: 21.4 pg — ABNORMAL LOW (ref 26.0–34.0)
MCHC: 31.9 g/dL (ref 30.0–36.0)
MCV: 66.9 fL — ABNORMAL LOW (ref 80.0–100.0)
Platelets: 167 10*3/uL (ref 150–400)
RBC: 4.87 MIL/uL (ref 3.87–5.11)
RDW: 15.6 % — ABNORMAL HIGH (ref 11.5–15.5)
WBC: 7.7 10*3/uL (ref 4.0–10.5)
nRBC: 0 % (ref 0.0–0.2)

## 2021-07-18 LAB — RPR: RPR Ser Ql: NONREACTIVE

## 2021-07-18 NOTE — Anesthesia Preprocedure Evaluation (Addendum)
Anesthesia Evaluation  Patient identified by MRN, date of birth, ID band Patient awake    Reviewed: Allergy & Precautions, H&P , NPO status , Patient's Chart, lab work & pertinent test results  Airway Mallampati: I  TM Distance: >3 FB Neck ROM: Full    Dental no notable dental hx. (+) Teeth Intact, Dental Advisory Given, Missing,    Pulmonary neg pulmonary ROS, former smoker,    Pulmonary exam normal breath sounds clear to auscultation       Cardiovascular Exercise Tolerance: Good hypertension, negative cardio ROS Normal cardiovascular exam Rhythm:Regular Rate:Normal     Neuro/Psych  Headaches, PSYCHIATRIC DISORDERS Anxiety Depression negative neurological ROS  negative psych ROS   GI/Hepatic negative GI ROS, Neg liver ROS,   Endo/Other  negative endocrine ROS  Renal/GU negative Renal ROS  negative genitourinary   Musculoskeletal negative musculoskeletal ROS (+)   Abdominal   Peds negative pediatric ROS (+)  Hematology negative hematology ROS (+) Blood dyscrasia, anemia ,   Anesthesia Other Findings   Reproductive/Obstetrics negative OB ROS                            Anesthesia Physical Anesthesia Plan  ASA: 2  Anesthesia Plan: Spinal   Post-op Pain Management:    Induction:   PONV Risk Score and Plan: 2 and Treatment may vary due to age or medical condition  Airway Management Planned: Natural Airway  Additional Equipment: None  Intra-op Plan:   Post-operative Plan:   Informed Consent: I have reviewed the patients History and Physical, chart, labs and discussed the procedure including the risks, benefits and alternatives for the proposed anesthesia with the patient or authorized representative who has indicated his/her understanding and acceptance.       Plan Discussed with: Anesthesiologist and CRNA  Anesthesia Plan Comments: (  )        Anesthesia Quick  Evaluation

## 2021-07-19 ENCOUNTER — Inpatient Hospital Stay (HOSPITAL_COMMUNITY): Payer: Medicaid Other | Admitting: Anesthesiology

## 2021-07-19 ENCOUNTER — Encounter (HOSPITAL_COMMUNITY)
Admission: RE | Disposition: A | Discharge status: Home / Self Care | Payer: Self-pay | Source: Home / Self Care | Attending: Family Medicine

## 2021-07-19 ENCOUNTER — Encounter (HOSPITAL_COMMUNITY): Payer: Self-pay | Admitting: Family Medicine

## 2021-07-19 ENCOUNTER — Other Ambulatory Visit: Payer: Self-pay

## 2021-07-19 ENCOUNTER — Inpatient Hospital Stay (HOSPITAL_COMMUNITY)
Admission: RE | Admit: 2021-07-19 | Discharge: 2021-07-22 | DRG: 787 | Disposition: A | Discharge status: Home / Self Care | Payer: Medicaid Other | Attending: Family Medicine | Admitting: Family Medicine

## 2021-07-19 DIAGNOSIS — Z7982 Long term (current) use of aspirin: Secondary | ICD-10-CM | POA: Diagnosis not present

## 2021-07-19 DIAGNOSIS — D62 Acute posthemorrhagic anemia: Secondary | ICD-10-CM | POA: Diagnosis not present

## 2021-07-19 DIAGNOSIS — Z23 Encounter for immunization: Secondary | ICD-10-CM | POA: Diagnosis not present

## 2021-07-19 DIAGNOSIS — Z3480 Encounter for supervision of other normal pregnancy, unspecified trimester: Secondary | ICD-10-CM

## 2021-07-19 DIAGNOSIS — O34211 Maternal care for low transverse scar from previous cesarean delivery: Secondary | ICD-10-CM

## 2021-07-19 DIAGNOSIS — Z148 Genetic carrier of other disease: Secondary | ICD-10-CM

## 2021-07-19 DIAGNOSIS — Z87891 Personal history of nicotine dependence: Secondary | ICD-10-CM

## 2021-07-19 DIAGNOSIS — O26893 Other specified pregnancy related conditions, third trimester: Secondary | ICD-10-CM | POA: Diagnosis present

## 2021-07-19 DIAGNOSIS — O99824 Streptococcus B carrier state complicating childbirth: Secondary | ICD-10-CM | POA: Diagnosis present

## 2021-07-19 DIAGNOSIS — O09299 Supervision of pregnancy with other poor reproductive or obstetric history, unspecified trimester: Secondary | ICD-10-CM

## 2021-07-19 DIAGNOSIS — Z3A39 39 weeks gestation of pregnancy: Secondary | ICD-10-CM

## 2021-07-19 DIAGNOSIS — R8271 Bacteriuria: Secondary | ICD-10-CM | POA: Diagnosis present

## 2021-07-19 DIAGNOSIS — O9081 Anemia of the puerperium: Secondary | ICD-10-CM | POA: Diagnosis not present

## 2021-07-19 DIAGNOSIS — O99820 Streptococcus B carrier state complicating pregnancy: Secondary | ICD-10-CM

## 2021-07-19 DIAGNOSIS — Z98891 History of uterine scar from previous surgery: Secondary | ICD-10-CM

## 2021-07-19 DIAGNOSIS — B951 Streptococcus, group B, as the cause of diseases classified elsewhere: Secondary | ICD-10-CM

## 2021-07-19 LAB — CBC
HCT: 26.6 % — ABNORMAL LOW (ref 36.0–46.0)
Hemoglobin: 8.8 g/dL — ABNORMAL LOW (ref 12.0–15.0)
MCH: 21.9 pg — ABNORMAL LOW (ref 26.0–34.0)
MCHC: 33.1 g/dL (ref 30.0–36.0)
MCV: 66.3 fL — ABNORMAL LOW (ref 80.0–100.0)
Platelets: 192 10*3/uL (ref 150–400)
RBC: 4.01 MIL/uL (ref 3.87–5.11)
RDW: 15.2 % (ref 11.5–15.5)
WBC: 15.9 10*3/uL — ABNORMAL HIGH (ref 4.0–10.5)
nRBC: 0 % (ref 0.0–0.2)

## 2021-07-19 SURGERY — Surgical Case
Anesthesia: Spinal | Site: Abdomen | Wound class: Clean Contaminated

## 2021-07-19 MED ORDER — NALOXONE HCL 0.4 MG/ML IJ SOLN: 0.4000 mg | INTRAMUSCULAR | Status: DC | PRN

## 2021-07-19 MED ORDER — PHENYLEPHRINE HCL-NACL 20-0.9 MG/250ML-% IV SOLN
INTRAVENOUS | Status: DC | PRN
  Administered 2021-07-19: 60 ug/min via INTRAVENOUS

## 2021-07-19 MED ORDER — NALOXONE HCL 4 MG/10ML IJ SOLN
1.0000 ug/kg/h | INTRAVENOUS | Status: DC | PRN
  Filled 2021-07-19: qty 5

## 2021-07-19 MED ORDER — MORPHINE SULFATE (PF) 0.5 MG/ML IJ SOLN
INTRAMUSCULAR | Status: AC
  Filled 2021-07-19: qty 10

## 2021-07-19 MED ORDER — DIPHENHYDRAMINE HCL 25 MG PO CAPS
25.0000 mg | ORAL_CAPSULE | Freq: Four times a day (QID) | ORAL | Status: DC | PRN
  Administered 2021-07-20: 25 mg via ORAL

## 2021-07-19 MED ORDER — STERILE WATER FOR IRRIGATION IR SOLN
Status: DC | PRN
  Administered 2021-07-19: 1000 mL

## 2021-07-19 MED ORDER — ACETAMINOPHEN 10 MG/ML IV SOLN
INTRAVENOUS | Status: AC
  Filled 2021-07-19: qty 100

## 2021-07-19 MED ORDER — ONDANSETRON HCL 4 MG/2ML IJ SOLN: 4.0000 mg | Freq: Three times a day (TID) | INTRAMUSCULAR | Status: DC | PRN

## 2021-07-19 MED ORDER — DEXAMETHASONE SODIUM PHOSPHATE 4 MG/ML IJ SOLN
INTRAMUSCULAR | Status: AC
  Filled 2021-07-19: qty 1

## 2021-07-19 MED ORDER — SCOPOLAMINE 1 MG/3DAYS TD PT72
1.0000 | MEDICATED_PATCH | Freq: Once | TRANSDERMAL | Status: AC
  Administered 2021-07-19: 1.5 mg via TRANSDERMAL

## 2021-07-19 MED ORDER — ENOXAPARIN SODIUM 60 MG/0.6ML IJ SOSY
50.0000 mg | PREFILLED_SYRINGE | INTRAMUSCULAR | Status: DC
  Administered 2021-07-20 – 2021-07-22 (×3): 50 mg via SUBCUTANEOUS
  Filled 2021-07-19 (×3): qty 0.6

## 2021-07-19 MED ORDER — SOD CITRATE-CITRIC ACID 500-334 MG/5ML PO SOLN
ORAL | Status: AC
  Filled 2021-07-19: qty 30

## 2021-07-19 MED ORDER — ACETAMINOPHEN 10 MG/ML IV SOLN
INTRAVENOUS | Status: DC | PRN
  Administered 2021-07-19: 1000 mg via INTRAVENOUS

## 2021-07-19 MED ORDER — MORPHINE SULFATE (PF) 0.5 MG/ML IJ SOLN
INTRAMUSCULAR | Status: DC | PRN
  Administered 2021-07-19: 150 ug via INTRATHECAL

## 2021-07-19 MED ORDER — NALBUPHINE HCL 10 MG/ML IJ SOLN
5.0000 mg | INTRAMUSCULAR | Status: DC | PRN
  Administered 2021-07-19: 5 mg via INTRAVENOUS

## 2021-07-19 MED ORDER — KETOROLAC TROMETHAMINE 30 MG/ML IJ SOLN: 30.0000 mg | Freq: Four times a day (QID) | INTRAMUSCULAR | Status: AC | PRN

## 2021-07-19 MED ORDER — TRANEXAMIC ACID-NACL 1000-0.7 MG/100ML-% IV SOLN
INTRAVENOUS | Status: DC | PRN
  Administered 2021-07-19: 1000 mg via INTRAVENOUS

## 2021-07-19 MED ORDER — ONDANSETRON HCL 4 MG/2ML IJ SOLN
INTRAMUSCULAR | Status: DC | PRN
  Administered 2021-07-19: 4 mg via INTRAVENOUS

## 2021-07-19 MED ORDER — GENTAMICIN SULFATE 40 MG/ML IJ SOLN
5.0000 mg/kg | INTRAVENOUS | Status: AC
  Administered 2021-07-19: 380 mg via INTRAVENOUS
  Filled 2021-07-19: qty 9.5

## 2021-07-19 MED ORDER — SODIUM CHLORIDE 0.9 % IR SOLN
Status: DC | PRN
  Administered 2021-07-19: 1000 mL

## 2021-07-19 MED ORDER — SCOPOLAMINE 1 MG/3DAYS TD PT72: 1.0000 | MEDICATED_PATCH | Freq: Once | TRANSDERMAL | Status: DC

## 2021-07-19 MED ORDER — WITCH HAZEL-GLYCERIN EX PADS: 1.0000 "application " | MEDICATED_PAD | CUTANEOUS | Status: DC | PRN

## 2021-07-19 MED ORDER — TETANUS-DIPHTH-ACELL PERTUSSIS 5-2.5-18.5 LF-MCG/0.5 IM SUSY: 0.5000 mL | PREFILLED_SYRINGE | Freq: Once | INTRAMUSCULAR | Status: DC

## 2021-07-19 MED ORDER — LACTATED RINGERS IV SOLN: INTRAVENOUS | Status: DC

## 2021-07-19 MED ORDER — PHENYLEPHRINE HCL-NACL 20-0.9 MG/250ML-% IV SOLN
INTRAVENOUS | Status: AC
  Filled 2021-07-19: qty 250

## 2021-07-19 MED ORDER — NALBUPHINE HCL 10 MG/ML IJ SOLN: 5.0000 mg | Freq: Once | INTRAMUSCULAR | Status: AC | PRN

## 2021-07-19 MED ORDER — MEPERIDINE HCL 25 MG/ML IJ SOLN: 6.2500 mg | INTRAMUSCULAR | Status: DC | PRN

## 2021-07-19 MED ORDER — OXYTOCIN-SODIUM CHLORIDE 30-0.9 UT/500ML-% IV SOLN: 2.5000 [IU]/h | INTRAVENOUS | Status: AC

## 2021-07-19 MED ORDER — SENNOSIDES-DOCUSATE SODIUM 8.6-50 MG PO TABS
2.0000 | ORAL_TABLET | Freq: Every day | ORAL | Status: DC
  Administered 2021-07-20 – 2021-07-22 (×3): 2 via ORAL
  Filled 2021-07-19 (×3): qty 2

## 2021-07-19 MED ORDER — NALBUPHINE HCL 10 MG/ML IJ SOLN
5.0000 mg | INTRAMUSCULAR | Status: DC | PRN
  Filled 2021-07-19: qty 1

## 2021-07-19 MED ORDER — OXYCODONE HCL 5 MG PO TABS
5.0000 mg | ORAL_TABLET | ORAL | Status: DC | PRN
  Administered 2021-07-20 (×2): 5 mg via ORAL
  Administered 2021-07-21: 10 mg via ORAL
  Administered 2021-07-21 – 2021-07-22 (×4): 5 mg via ORAL
  Filled 2021-07-19 (×3): qty 1
  Filled 2021-07-19: qty 2
  Filled 2021-07-19 (×3): qty 1

## 2021-07-19 MED ORDER — SODIUM CHLORIDE 0.9% FLUSH: 3.0000 mL | INTRAVENOUS | Status: DC | PRN

## 2021-07-19 MED ORDER — LACTATED RINGERS IV BOLUS
500.0000 mL | Freq: Once | INTRAVENOUS | Status: AC
  Administered 2021-07-19: 500 mL via INTRAVENOUS

## 2021-07-19 MED ORDER — NALBUPHINE HCL 10 MG/ML IJ SOLN
5.0000 mg | Freq: Once | INTRAMUSCULAR | Status: AC | PRN
Start: 2021-07-19 — End: 2021-07-19
  Administered 2021-07-19: 5 mg via INTRAVENOUS

## 2021-07-19 MED ORDER — CLINDAMYCIN PHOSPHATE 900 MG/50ML IV SOLN
INTRAVENOUS | Status: AC
  Filled 2021-07-19: qty 50

## 2021-07-19 MED ORDER — DEXMEDETOMIDINE HCL IN NACL 200 MCG/50ML IV SOLN
INTRAVENOUS | Status: DC | PRN
  Administered 2021-07-19: 8 ug via INTRAVENOUS

## 2021-07-19 MED ORDER — DEXTROSE 5 % IV SOLN
1.0000 ug/kg/h | INTRAVENOUS | Status: DC | PRN
  Filled 2021-07-19: qty 5

## 2021-07-19 MED ORDER — CHLOROPROCAINE HCL (PF) 3 % IJ SOLN
INTRAMUSCULAR | Status: DC | PRN
  Administered 2021-07-19: 20 mL

## 2021-07-19 MED ORDER — DIPHENHYDRAMINE HCL 25 MG PO CAPS
25.0000 mg | ORAL_CAPSULE | ORAL | Status: DC | PRN
  Administered 2021-07-20: 25 mg via ORAL
  Filled 2021-07-19: qty 1

## 2021-07-19 MED ORDER — SIMETHICONE 80 MG PO CHEW
80.0000 mg | CHEWABLE_TABLET | Freq: Three times a day (TID) | ORAL | Status: DC
  Administered 2021-07-19 – 2021-07-22 (×8): 80 mg via ORAL
  Filled 2021-07-19 (×6): qty 1

## 2021-07-19 MED ORDER — NALBUPHINE HCL 10 MG/ML IJ SOLN: 5.0000 mg | Freq: Once | INTRAMUSCULAR | Status: DC | PRN

## 2021-07-19 MED ORDER — DIPHENHYDRAMINE HCL 50 MG/ML IJ SOLN: 12.5000 mg | INTRAMUSCULAR | Status: DC | PRN

## 2021-07-19 MED ORDER — CLINDAMYCIN PHOSPHATE 900 MG/50ML IV SOLN
900.0000 mg | INTRAVENOUS | Status: AC
  Administered 2021-07-19: 900 mg via INTRAVENOUS

## 2021-07-19 MED ORDER — DIBUCAINE (PERIANAL) 1 % EX OINT: 1.0000 "application " | TOPICAL_OINTMENT | CUTANEOUS | Status: DC | PRN

## 2021-07-19 MED ORDER — ZOLPIDEM TARTRATE 5 MG PO TABS: 5.0000 mg | ORAL_TABLET | Freq: Every evening | ORAL | Status: DC | PRN

## 2021-07-19 MED ORDER — NALBUPHINE HCL 10 MG/ML IJ SOLN
INTRAMUSCULAR | Status: AC
  Filled 2021-07-19: qty 1

## 2021-07-19 MED ORDER — KETOROLAC TROMETHAMINE 30 MG/ML IJ SOLN
INTRAMUSCULAR | Status: AC
  Filled 2021-07-19: qty 1

## 2021-07-19 MED ORDER — PHENYLEPHRINE HCL (PRESSORS) 10 MG/ML IV SOLN
INTRAVENOUS | Status: DC | PRN
  Administered 2021-07-19 (×2): 80 ug via INTRAVENOUS
  Administered 2021-07-19: 40 ug via INTRAVENOUS

## 2021-07-19 MED ORDER — BUPIVACAINE IN DEXTROSE 0.75-8.25 % IT SOLN
INTRATHECAL | Status: DC | PRN
Start: 2021-07-19 — End: 2021-07-19
  Administered 2021-07-19: 1.6 mL via INTRATHECAL

## 2021-07-19 MED ORDER — MENTHOL 3 MG MT LOZG: 1.0000 | LOZENGE | OROMUCOSAL | Status: DC | PRN

## 2021-07-19 MED ORDER — KETOROLAC TROMETHAMINE 30 MG/ML IJ SOLN
30.0000 mg | Freq: Four times a day (QID) | INTRAMUSCULAR | Status: AC | PRN
  Administered 2021-07-19: 30 mg via INTRAVENOUS

## 2021-07-19 MED ORDER — SIMETHICONE 80 MG PO CHEW: 80.0000 mg | CHEWABLE_TABLET | ORAL | Status: DC | PRN

## 2021-07-19 MED ORDER — PRENATAL MULTIVITAMIN CH
1.0000 | ORAL_TABLET | Freq: Every day | ORAL | Status: DC
  Administered 2021-07-20: 1 via ORAL
  Filled 2021-07-19 (×2): qty 1

## 2021-07-19 MED ORDER — IBUPROFEN 600 MG PO TABS
600.0000 mg | ORAL_TABLET | Freq: Four times a day (QID) | ORAL | Status: AC
  Administered 2021-07-19 – 2021-07-21 (×8): 600 mg via ORAL
  Filled 2021-07-19 (×9): qty 1

## 2021-07-19 MED ORDER — FENTANYL CITRATE (PF) 100 MCG/2ML IJ SOLN
INTRAMUSCULAR | Status: AC
  Filled 2021-07-19: qty 2

## 2021-07-19 MED ORDER — ACETAMINOPHEN 500 MG PO TABS
1000.0000 mg | ORAL_TABLET | Freq: Four times a day (QID) | ORAL | Status: DC
  Administered 2021-07-19 – 2021-07-22 (×10): 1000 mg via ORAL
  Filled 2021-07-19 (×12): qty 2

## 2021-07-19 MED ORDER — SCOPOLAMINE 1 MG/3DAYS TD PT72
MEDICATED_PATCH | TRANSDERMAL | Status: AC
  Filled 2021-07-19: qty 1

## 2021-07-19 MED ORDER — DEXAMETHASONE SODIUM PHOSPHATE 4 MG/ML IJ SOLN
INTRAMUSCULAR | Status: DC | PRN
  Administered 2021-07-19: 4 mg via INTRAVENOUS

## 2021-07-19 MED ORDER — ONDANSETRON HCL 4 MG/2ML IJ SOLN
INTRAMUSCULAR | Status: AC
  Filled 2021-07-19: qty 2

## 2021-07-19 MED ORDER — COCONUT OIL OIL
1.0000 "application " | TOPICAL_OIL | Status: DC | PRN
  Administered 2021-07-21: 1 via TOPICAL

## 2021-07-19 MED ORDER — FENTANYL CITRATE (PF) 100 MCG/2ML IJ SOLN
INTRAMUSCULAR | Status: DC | PRN
  Administered 2021-07-19: 15 ug via INTRATHECAL

## 2021-07-19 MED ORDER — NALBUPHINE HCL 10 MG/ML IJ SOLN
5.0000 mg | INTRAMUSCULAR | Status: DC | PRN
  Administered 2021-07-20: 5 mg via SUBCUTANEOUS

## 2021-07-19 MED ORDER — DIPHENHYDRAMINE HCL 25 MG PO CAPS
25.0000 mg | ORAL_CAPSULE | ORAL | Status: DC | PRN
  Filled 2021-07-19: qty 1

## 2021-07-19 MED ORDER — PHENYLEPHRINE 40 MCG/ML (10ML) SYRINGE FOR IV PUSH (FOR BLOOD PRESSURE SUPPORT)
PREFILLED_SYRINGE | INTRAVENOUS | Status: AC
  Filled 2021-07-19: qty 10

## 2021-07-19 MED ORDER — OXYTOCIN-SODIUM CHLORIDE 30-0.9 UT/500ML-% IV SOLN
INTRAVENOUS | Status: DC | PRN
  Administered 2021-07-19: 300 mL via INTRAVENOUS

## 2021-07-19 MED ORDER — TRANEXAMIC ACID-NACL 1000-0.7 MG/100ML-% IV SOLN
INTRAVENOUS | Status: AC
  Filled 2021-07-19: qty 100

## 2021-07-19 MED ORDER — SOD CITRATE-CITRIC ACID 500-334 MG/5ML PO SOLN
30.0000 mL | ORAL | Status: AC
  Administered 2021-07-19: 30 mL via ORAL

## 2021-07-19 MED ORDER — FENTANYL CITRATE (PF) 100 MCG/2ML IJ SOLN
INTRAMUSCULAR | Status: DC | PRN
  Administered 2021-07-19: 85 ug via INTRAVENOUS

## 2021-07-19 MED ORDER — FENTANYL CITRATE (PF) 100 MCG/2ML IJ SOLN: INTRAMUSCULAR | Status: DC | PRN

## 2021-07-19 MED ORDER — OXYTOCIN-SODIUM CHLORIDE 30-0.9 UT/500ML-% IV SOLN
INTRAVENOUS | Status: AC
  Filled 2021-07-19: qty 500

## 2021-07-19 SURGICAL SUPPLY — 36 items
BENZOIN TINCTURE PRP APPL 2/3 (GAUZE/BANDAGES/DRESSINGS) ×2 IMPLANT
CHLORAPREP W/TINT 26ML (MISCELLANEOUS) ×2 IMPLANT
CLIP FILSHIE TUBAL LIGA STRL (Clip) IMPLANT
CLOSURE STERI STRIP 1/2 X4 (GAUZE/BANDAGES/DRESSINGS) ×2 IMPLANT
CLOTH BEACON ORANGE TIMEOUT ST (SAFETY) ×2 IMPLANT
DRSG OPSITE POSTOP 4X10 (GAUZE/BANDAGES/DRESSINGS) ×2 IMPLANT
ELECT REM PT RETURN 9FT ADLT (ELECTROSURGICAL) ×2
ELECTRODE REM PT RTRN 9FT ADLT (ELECTROSURGICAL) ×1 IMPLANT
EXTRACTOR VACUUM KIWI (MISCELLANEOUS) ×2 IMPLANT
GAUZE SPONGE 4X4 12PLY STRL LF (GAUZE/BANDAGES/DRESSINGS) ×4 IMPLANT
GLOVE BIOGEL PI IND STRL 7.0 (GLOVE) ×3 IMPLANT
GLOVE BIOGEL PI INDICATOR 7.0 (GLOVE) ×3
GLOVE ECLIPSE 6.5 STRL STRAW (GLOVE) ×2 IMPLANT
GOWN STRL REUS W/ TWL LRG LVL3 (GOWN DISPOSABLE) ×2 IMPLANT
GOWN STRL REUS W/TWL LRG LVL3 (GOWN DISPOSABLE) ×2
HEMOSTAT ARISTA ABSORB 3G PWDR (HEMOSTASIS) ×2 IMPLANT
NS IRRIG 1000ML POUR BTL (IV SOLUTION) ×2 IMPLANT
PAD ABD 7.5X8 STRL (GAUZE/BANDAGES/DRESSINGS) ×2 IMPLANT
PAD OB MATERNITY 4.3X12.25 (PERSONAL CARE ITEMS) ×2 IMPLANT
PAD PREP 24X48 CUFFED NSTRL (MISCELLANEOUS) ×2 IMPLANT
RETRACTOR WND ALEXIS 25 LRG (MISCELLANEOUS) IMPLANT
RTRCTR WOUND ALEXIS 25CM LRG (MISCELLANEOUS)
SPONGE LAP 18X18 X RAY DECT (DISPOSABLE) ×2 IMPLANT
STRIP CLOSURE SKIN 1/2X4 (GAUZE/BANDAGES/DRESSINGS) ×2 IMPLANT
SUT MNCRL 0 VIOLET CTX 36 (SUTURE) ×2 IMPLANT
SUT MON AB-0 CT1 36 (SUTURE) ×2 IMPLANT
SUT MONOCRYL 0 CTX 36 (SUTURE) ×2
SUT PLAIN 0 NONE (SUTURE) ×2 IMPLANT
SUT PLAIN 2 0 XLH (SUTURE) ×2 IMPLANT
SUT VIC AB 0 CT1 36 (SUTURE) ×4 IMPLANT
SUT VIC AB 2-0 CT1 27 (SUTURE) ×1
SUT VIC AB 2-0 CT1 TAPERPNT 27 (SUTURE) ×1 IMPLANT
SUT VIC AB 4-0 KS 27 (SUTURE) ×2 IMPLANT
TOWEL OR 17X24 6PK STRL BLUE (TOWEL DISPOSABLE) ×6 IMPLANT
TRAY FOLEY CATH SILVER 16FR (SET/KITS/TRAYS/PACK) ×2 IMPLANT
WATER STERILE IRR 1000ML POUR (IV SOLUTION) ×2 IMPLANT

## 2021-07-19 NOTE — Anesthesia Postprocedure Evaluation (Signed)
Anesthesia Post Note  Patient: [facility]  Procedure(s) Performed: CESAREAN SECTION (Abdomen)     Patient location during evaluation: PACU Anesthesia Type: Spinal Level of consciousness: oriented and awake and alert Pain management: pain level controlled Vital Signs Assessment: post-procedure vital signs reviewed and stable Respiratory status: spontaneous breathing, respiratory function stable and patient connected to nasal cannula oxygen Cardiovascular status: blood pressure returned to baseline and stable Postop Assessment: no headache, no backache and no apparent nausea or vomiting Anesthetic complications: no   No notable events documented.  Last Vitals:  Vitals:   07/19/21 1215 07/19/21 1232  BP: 111/74 (!) 110/54  Pulse: 84 86  Resp: 20 16  Temp:    SpO2: 99% 99%    Last Pain:  Vitals:   07/19/21 1232  TempSrc:   PainSc: 8    Pain Goal:                Epidural/Spinal Function Cutaneous sensation: Tingles (07/19/21 1232), Patient able to flex knees: Yes (07/19/21 1232), Patient able to lift hips off bed: Yes (07/19/21 1232), Back pain beyond tenderness at insertion site: No (07/19/21 1232), Progressively worsening motor and/or sensory loss: No (07/19/21 1232), Bowel and/or bladder incontinence post epidural: No (07/19/21 1232)  Savannaha Stonerock

## 2021-07-19 NOTE — Anesthesia Procedure Notes (Signed)
Spinal  Patient location during procedure: OR Start time: 07/19/2021 9:43 AM End time: 07/19/2021 9:43 AM Reason for block: surgical anesthesia Staffing Performed: other anesthesia staff  Anesthesiologist: Bethena Midget, MD Resident/CRNA: Elgie Congo, CRNA Preanesthetic Checklist Completed: patient identified, IV checked, site marked, risks and benefits discussed, surgical consent, monitors and equipment checked, pre-op evaluation and timeout performed Spinal Block Patient position: sitting Prep: ChloraPrep Patient monitoring: heart rate, continuous pulse ox and blood pressure Approach: midline Location: L4-5 Injection technique: single-shot Needle Needle type: Pencan  Needle gauge: 27 G Needle length: 5 cm Catheter type: closed end flexible Assessment Sensory level: T4 Additional Notes SAB x1 by Karna Christmas, SRNA

## 2021-07-19 NOTE — Op Note (Addendum)
Attestation of Attending Supervision of OB Fellow: Evaluation and management procedures were performed by the Family Medicine OB Fellow under my supervision.  I have reviewed the Fellow's note and chart. I was gloved and gowned for the entirety of the procedure and involved during the case. I have made any necessary editorial changes.    Federico Flake, MD, MPH, ABFM Attending Physician Center for [facility]    Cesarean Section Operative Note Patient: Sarah Copeland PROCEDURE DATE: 07/19/2021  PREOPERATIVE DIAGNOSES: Intrauterine pregnancy at [redacted]w[redacted]d weeks gestation; scheduled repeat CS  POSTOPERATIVE DIAGNOSES: The same  PROCEDURE: Repeat Low Transverse Cesarean Section  SURGEON:  Karyl Kinnier, MD  ASSISTANT:  Warner Mccreedy, MD   INDICATIONS: Sarah Copeland is a 25 y.o. [phone] at [redacted]w[redacted]d here for cesarean section secondary to the indications listed under preoperative diagnoses.  The risks of surgery were discussed with the patient including but were not limited to: bleeding which may require transfusion or reoperation; infection which may require antibiotics; injury to bowel, bladder, ureters or other surrounding organs; injury to the fetus; need for additional procedures including hysterectomy in the event of a life-threatening hemorrhage; placental abnormalities wth subsequent pregnancies, incisional problems, thromboembolic phenomenon and other postoperative/anesthesia complications. The patient concurred with the proposed plan, giving informed written consent for the procedures.    FINDINGS:  Viable female infant in cephalic presentation.  Apgars pending.  Clear amniotic fluid.  Intact placenta, three vessel cord.  Normal uterus, fallopian tubes and ovaries bilaterally. Moderate adhesive disease  ANESTHESIA: Spinal INTRAVENOUS FLUIDS: 500 ml ESTIMATED BLOOD LOSS: 909 ml URINE OUTPUT:  300 ml SPECIMENS: Placenta sent to L&D COMPLICATIONS: None  immediate  PROCEDURE IN DETAIL:  The patient preoperatively received intravenous antibiotics and had sequential compression devices applied to her lower extremities.   She was then taken to the operating room where spinal anesthesia was administered and was found to be adequate. She was then placed in a dorsal supine position with a leftward tilt, and prepped and draped in a sterile manner.  A foley catheter was placed into her bladder and attached to constant gravity.  After an adequate timeout was performed, a Pfannenstiel skin incision was made with scalpel and carried through to the underlying layer of fascia. The fascia was incised in the midline, and this incision was extended bilaterally using the Mayo scissors.  Kocher clamps were applied to the superior aspect of the fascial incision and the underlying rectus muscles were dissected off bluntly. A similar process was carried out on the inferior aspect of the fascial incision. The rectus muscles were separated in the midline initially bluntly however due to increased scar tissue and tight band, ultimately separation was made using bovie and the peritoneum was entered sharply using hemostats and Metzenbaum scissors. An Alexis retractor was then placed.   Attention was turned to the lower uterine segment where a low transverse hysterotomy was made with a scalpel and extended bilaterally bluntly. Head was attempted to be brought to the hysterotomy site however due to limited space, the alexis retractor was removed, and a bladder blade and Rich retractors were placed. At that point the kiwi vacuum was used to attempt delivery of head however due to fetal hair and blood did not have appropriate suction and there were two pop offs. Ultimately bandage scissors were utilized to extend all layers of the incision on patient's right.  At that point the infant was successfully delivered, the cord was clamped after 1 minute  and was cut and the infant was handed over  to awaiting neonatology team. Uterine massage was then administered, and the placenta delivered intact with a three-vessel cord. The uterus was then cleared of clot and debris.  The hysterotomy was closed with 0 Vicryl in a running locked fashion, and an imbricating layer was also placed with 0 Vicryl. There was some bleeding noted in the right peritoneum at area where incision was extended so two figure of eight stitches were done to achieve hemostasis.   Due to excessive scarring and adhesion between peritoneum and muscles no peritoneal suture was done. The muscles were reapproximated using 0 Vicryl interrupted stitches. The fascia was then closed using 0 Vicryl in a running fashion.  The subcutaneous layer was irrigated, then reapproximated with 2-0 plain gut interrupted stitches.  The skin was closed with a 4-0 Vicryl subcuticular stitch. The patient tolerated the procedure well. Sponge, lap, instrument and needle counts were correct x 2.  She was taken to the recovery room in stable condition.   Warner Mccreedy, MD, MPH OB Fellow Faculty Practice- [facility]

## 2021-07-19 NOTE — Transfer of Care (Signed)
Immediate Anesthesia Transfer of Care Note  Patient: [facility]  Procedure(s) Performed: CESAREAN SECTION (Abdomen)  Patient Location: PACU  Anesthesia Type:Spinal  Level of Consciousness: awake, alert  and oriented  Airway & Oxygen Therapy: Patient Spontanous Breathing  Post-op Assessment: Report given to RN and Post -op Vital signs reviewed and stable  Post vital signs: stable  Last Vitals:  Vitals Value Taken Time  BP 107/72 07/19/21 1200  Temp    Pulse 77 07/19/21 1201  Resp 16 07/19/21 1201  SpO2 100 % 07/19/21 1201  Vitals shown include unvalidated device data.  Last Pain:  Vitals:   07/19/21 0813  TempSrc: Oral         Complications: No notable events documented.

## 2021-07-19 NOTE — H&P (Signed)
Obstetric Preoperative History and Physical  Sarah Copeland is a 25 y.o. [id number] with IUP at [redacted]w[redacted]d presenting for presenting for scheduled cesarean section.  No acute concerns. Plan on at least 1 more child.   Prenatal Course Source of Care: [facility]  with onset of care at 10 weeks Pregnancy complications or risks: Patient Active Problem List   Diagnosis Date Noted   S/P repeat low transverse C-section 07/19/2021   Vaginal yeast infection 04/23/2021   Genetic carrier 03/29/2021   Group B streptococcal bacteriuria 02/25/2021   Supervision of other normal pregnancy, antepartum 12/31/2020   History of 2 cesarean sections 12/31/2020   Hx of preeclampsia, prior pregnancy, currently pregnant 12/31/2020   She plans to breastfeed She desires no method for postpartum contraception.   Prenatal labs and studies: ABO, Rh: --/--/O POS (10/26 3142) Antibody: NEG (10/26 0953) Rubella: 1.63 (04/19 0909) RPR: NON REACTIVE (10/26 0950)  HBsAg: Negative (04/19 0909)  HIV: NON REACTIVE (10/26 0955)  JAR:WPTYYPEJ/-- (09/18 0000) 2hr GTT- WNL Genetic screening normal Anatomy US normal  Prenatal Transfer Tool  Maternal Diabetes: No Genetic Screening: Normal Maternal Ultrasounds/Referrals: Normal Fetal Ultrasounds or other Referrals:  None Maternal Substance Abuse:  No Significant Maternal Medications:  None Significant Maternal Lab Results: Group B Strep positive  Past Medical History:  Diagnosis Date   Anemia    Anxiety    Depression    stressed, going through custody battle   Headache    Kidney stones    Medical history non-contributory    Preeclampsia 2018   Vaginal Pap smear, abnormal    f/u PP    Past Surgical History:  Procedure Laterality Date   CESAREAN SECTION      OB History  Gravida Para Term Preterm AB Living  3 2 2     2   SAB IAB Ectopic Multiple Live Births          2    # Outcome Date GA Lbr Len/2nd Weight Sex Delivery Anes PTL Lv  3 Current            2 Term 01/19/19    M CS-Unspec   LIV  1 Term 12/17/16    F CS-Unspec   LIV    Obstetric Comments  1- Pre E  2- wasn't dilating    Social History   Socioeconomic History   Marital status: Single    Spouse name: Not on file   Number of children: Not on file   Years of education: Not on file   Highest education level: Not on file  Occupational History   Not on file  Tobacco Use   Smoking status: Former    Types: Cigars    Quit date: 06/13/2021    Years since quitting: 0.0   Smokeless tobacco: Never   Tobacco comments:    black and mild  Vaping Use   Vaping Use: Never used  Substance and Sexual Activity   Alcohol use: Never   Drug use: Never   Sexual activity: Yes    Birth control/protection: None  Other Topics Concern   Not on file  Social History Narrative   Not on file   Social Determinants of Health   Financial Resource Strain: Not on file  Food Insecurity: Food Insecurity Present   Worried About Running Out of Food in the Last Year: Sometimes true   Ran Out of Food in the Last Year: Sometimes true  Transportation Needs: No Transportation Needs   Lack of Transportation (  Medical): No   Lack of Transportation (Non-Medical): No  Physical Activity: Not on file  Stress: Not on file  Social Connections: Not on file    Family History  Problem Relation Age of Onset   Depression Mother    Hypertension Mother    Bipolar disorder Mother    Schizophrenia Mother    Kidney disease Father        64- kidney failure    Medications Prior to Admission  Medication Sig Dispense Refill Last Dose   aspirin 81 MG chewable tablet Chew 1 tablet (81 mg total) by mouth daily. 30 tablet 5    cyclobenzaprine (FEXMID) 7.5 MG tablet Take 1 tablet (7.5 mg total) by mouth 3 (three) times daily as needed (headache). 20 tablet 0    famotidine (PEPCID) 20 MG tablet Take 1 tablet (20 mg total) by mouth 2 (two) times daily. (Patient taking differently: Take 20 mg by mouth 2 (two)  times daily as needed for heartburn or indigestion.) 60 tablet 2    metoCLOPramide (REGLAN) 10 MG tablet Take 1 tablet (10 mg total) by mouth every 8 (eight) hours as needed for nausea (or headache). 30 tablet 0    prenatal vitamin w/FE, FA (PRENATAL 1 + 1) 27-1 MG TABS tablet Take 1 tablet by mouth daily at 12 noon. (Patient taking differently: Take 1 tablet by mouth in the morning.) 30 tablet 11    ferrous sulfate 325 (65 FE) MG tablet Take 1 tablet (325 mg total) by mouth every other day. (Patient not taking: Reported on 07/17/2021) 45 tablet 2 Not Taking    Allergies  Allergen Reactions   Amoxicillin Anaphylaxis    Review of Systems: Negative except for what is mentioned in HPI.  Physical Exam: BP 124/86   Pulse 98   Temp 98.6 F (37 C) (Oral)   Resp 17   Ht 5\' 7"  (1.702 m)   Wt 99.8 kg   LMP 10/20/2020 (Approximate)   SpO2 100%   BMI 34.46 kg/m  FHR by Doppler: 130 bpm CONSTITUTIONAL: Well-developed, well-nourished female in no acute distress.  HENT:  Normocephalic, atraumatic, External right and left ear normal. Oropharynx is clear and moist EYES: Conjunctivae and EOM are normal. Pupils are equal, round, and reactive to light. No scleral icterus.  NECK: Normal range of motion, supple, no masses SKIN: Skin is warm and dry. No rash noted. Not diaphoretic. No erythema. No pallor. NEUROLGIC: Alert and oriented to person, place, and time. Normal reflexes, muscle tone coordination. No cranial nerve deficit noted. PSYCHIATRIC: Normal mood and affect. Normal behavior. Normal judgment and thought content. CARDIOVASCULAR: Normal heart rate noted, regular rhythm RESPIRATORY: Effort and breath sounds normal, no problems with respiration noted ABDOMEN: Soft, nontender, nondistended, gravid. Well-healed Pfannenstiel incision. PELVIC: Deferred MUSCULOSKELETAL: Normal range of motion. No edema and no tenderness. 2+ distal pulses.  Pertinent Labs/Studies:   Results for orders placed or  performed during the hospital encounter of 07/17/21 (from the past 72 hour(s))  CBC     Status: Abnormal   Collection Time: 07/17/21  9:50 AM  Result Value Ref Range   WBC 7.7 4.0 - 10.5 K/uL   RBC 4.87 3.87 - 5.11 MIL/uL   Hemoglobin 10.4 (L) 12.0 - 15.0 g/dL   HCT 07/19/21 (L) 40.9 - 81.1 %   MCV 66.9 (L) 80.0 - 100.0 fL   MCH 21.4 (L) 26.0 - 34.0 pg   MCHC 31.9 30.0 - 36.0 g/dL   RDW 91.4 (H) 78.2 - 95.6 %  Platelets 167 150 - 400 K/uL    Comment: Immature Platelet Fraction may be clinically indicated, consider ordering this additional test [id number] REPEATED TO VERIFY    nRBC 0.0 0.0 - 0.2 %    Comment: Performed at [facility], [address], [place], [place] [address]  RPR     Status: None   Collection Time: 07/17/21  9:50 AM  Result Value Ref Range   RPR Ser Ql NON REACTIVE NON REACTIVE    Comment: Performed at [facility], [address] [place], [place] [address]  Type and screen     Status: None   Collection Time: 07/17/21  9:53 AM  Result Value Ref Range   ABO/RH(D) O POS    Antibody Screen NEG    Sample Expiration      07/20/2021,2359 Performed at [facility], [address] [place], [place] [address]   Rapid HIV screen (HIV 1/2 Ab+Ag)     Status: None   Collection Time: 07/17/21  9:55 AM  Result Value Ref Range   HIV-1 P24 Antigen - HIV24 NON REACTIVE NON REACTIVE    Comment: (NOTE) Detection of p24 may be inhibited by biotin in the sample, causing false negative results in acute infection.    HIV 1/2 Antibodies NON REACTIVE NON REACTIVE   Interpretation (HIV Ag Ab)      A non reactive test result means that HIV 1 or HIV 2 antibodies and HIV 1 p24 antigen were not detected in the specimen.    Comment: Performed at [facility], [address], [place], [place] [address]    Assessment and Plan :Claira Jeter is a 25 y.o. G3P2002 at [redacted]w[redacted]d being admitted being admitted for scheduled cesarean section. The risks of cesarean section  discussed with the patient included but were not limited to: bleeding which may require transfusion or reoperation; infection which may require antibiotics; injury to bowel, bladder, ureters or other surrounding organs; injury to the fetus; need for additional procedures including hysterectomy in the event of a life-threatening hemorrhage; placental abnormalities wth subsequent pregnancies, incisional problems, thromboembolic phenomenon and other postoperative/anesthesia complications. The patient concurred with the proposed plan, giving informed written consent for the procedure. Patient has been NPO since last night she will remain NPO for procedure. Anesthesia and OR aware. Preoperative prophylactic antibiotics and SCDs ordered on call to the OR. To OR when ready.    Federico Flake, MD, MPH, ABFM Attending Physician Center for [facility]

## 2021-07-19 NOTE — Discharge Summary (Deleted)
Postpartum Discharge Summary     Patient Name: Sarah Copeland DOB: Nov 16, 1995 MRN: [id number]  Date of admission: 07/19/2021 Delivery date:07/19/2021  Delivering provider: Caren Macadam  Date of discharge: 07/22/2021  Admitting diagnosis: S/P repeat low transverse C-section [Z98.891] Intrauterine pregnancy: [redacted]w[redacted]d     Secondary diagnosis:  Active Problems:   Supervision of other normal pregnancy, antepartum   Hx of preeclampsia, prior pregnancy, currently pregnant   Group B streptococcal bacteriuria   Genetic carrier   S/P repeat low transverse C-section  Additional problems: None    Discharge diagnosis: Term Pregnancy Delivered                                              Post partum procedures: n/a Augmentation: N/A Complications: None  Hospital course: Sceduled C/S   25 y.o. yo G3P3003 at [redacted]w[redacted]d was admitted to the hospital 07/19/2021 for scheduled cesarean section with the following indication:Elective Repeat.Delivery details are as follows:  Membrane Rupture Time/Date: 10:29 AM ,07/19/2021   Delivery Method:C-Section, Vacuum Assisted  Details of operation can be found in separate operative note.  Patient had an uncomplicated postpartum course.  She is ambulating, tolerating a regular diet, passing flatus, and urinating well. Patient is discharged home in stable condition on  07/22/21        Newborn Data: Birth date:07/19/2021  Birth time:10:31 AM  Gender:Female  Living status:Living  Apgars:8 ,9  Weight:2990 g     Magnesium Sulfate received: No BMZ received: No Rhophylac:N/A MMR:N/A T-DaP:Given prenatally Flu: No, declined prenatally Transfusion:No  Physical exam  Vitals:   07/21/21 0421 07/21/21 1357 07/21/21 2200 07/22/21 0530  BP: 118/70 133/81 121/71 120/76  Pulse: 84  97 78  Resp: $Remo'18 17 18 18  'DZwKe$ Temp: 98.5 F (36.9 C) 98 F (36.7 C) 98 F (36.7 C) 98 F (36.7 C)  TempSrc: Oral Axillary Axillary   SpO2:   100% 100%  Weight:      Height:        General: alert, cooperative, and no distress Lochia: appropriate Uterine Fundus: firm Incision: Healing well with no significant drainage, No significant erythema, Dressing is clean, dry, and intact DVT Evaluation: No evidence of DVT seen on physical exam. Negative Homan's sign. No cords or calf tenderness. No significant calf/ankle edema. Labs: Lab Results  Component Value Date   WBC 16.3 (H) 07/20/2021   HGB 8.2 (L) 07/20/2021   HCT 25.1 (L) 07/20/2021   MCV 65.7 (L) 07/20/2021   PLT 201 07/20/2021   CMP Latest Ref Rng & Units 03/01/2021  Glucose 70 - 99 mg/dL 77  BUN 6 - 20 mg/dL 7  Creatinine 0.44 - 1.00 mg/dL 0.50  Sodium 135 - 145 mmol/L 134(L)  Potassium 3.5 - 5.1 mmol/L 3.3(L)  Chloride 98 - 111 mmol/L 105  CO2 22 - 32 mmol/L 20(L)  Calcium 8.9 - 10.3 mg/dL 9.4  Total Protein 6.5 - 8.1 g/dL 7.3  Total Bilirubin 0.3 - 1.2 mg/dL 0.4  Alkaline Phos 38 - 126 U/L 68  AST 15 - 41 U/L 16  ALT 0 - 44 U/L 13   Edinburgh Score: Edinburgh Postnatal Depression Scale Screening Tool 07/20/2021  I have been able to laugh and see the funny side of things. 0  I have looked forward with enjoyment to things. 0  I have blamed myself unnecessarily when things went wrong.  0  I have been anxious or worried for no good reason. 0  I have felt scared or panicky for no good reason. 0  Things have been getting on top of me. 0  I have been so unhappy that I have had difficulty sleeping. 0  I have felt sad or miserable. 0  I have been so unhappy that I have been crying. 0  The thought of harming myself has occurred to me. 0  Edinburgh Postnatal Depression Scale Total 0     After visit meds:  Allergies as of 07/22/2021       Reactions   Amoxicillin Anaphylaxis        Medication List     STOP taking these medications    aspirin 81 MG chewable tablet   cyclobenzaprine 7.5 MG tablet Commonly known as: FEXMID   famotidine 20 MG tablet Commonly known as: PEPCID    metoCLOPramide 10 MG tablet Commonly known as: REGLAN       TAKE these medications    acetaminophen 325 MG tablet Commonly known as: TYLENOL Take 2 tablets (650 mg total) by mouth every 6 (six) hours.   ascorbic acid 500 MG tablet Commonly known as: VITAMIN C Take 1 tablet (500 mg total) by mouth daily.   ferrous sulfate 325 (65 FE) MG tablet Take 1 tablet (325 mg total) by mouth every other day.   oxyCODONE 5 MG immediate release tablet Commonly known as: Oxy IR/ROXICODONE Take 1 tablet (5 mg total) by mouth every 4 (four) hours as needed for up to 5 days for severe pain.   prenatal vitamin w/FE, FA 27-1 MG Tabs tablet Take 1 tablet by mouth daily at 12 noon. What changed: when to take this         Discharge home in stable condition Infant Feeding: Breast Infant Disposition:home with mother Discharge instruction: per After Visit Summary and Postpartum booklet. Activity: Advance as tolerated. Pelvic rest for 6 weeks.  Diet: routine diet Future Appointments: Future Appointments  Date Time Provider [place]  07/29/2021  2:00 PM [facility] NURSE [facility]  08/22/2021  1:35 PM Anyanwu, Sallyanne Havers, MD [facility]   Follow up Visit: Message sent to [facility] by Dr. Cy Blamer on 07/19/2021  Please schedule this patient for a In person postpartum visit in 4 weeks with the following provider: Any provider. Additional Postpartum F/U:Incision check 1 week  Low risk pregnancy complicated by:  None Delivery mode:  C-Section, Vacuum Assisted  Anticipated Birth Control:   Plans condoms   07/22/2021 Naaman Plummer Autry-Lott, DO

## 2021-07-20 LAB — CBC
HCT: 25.1 % — ABNORMAL LOW (ref 36.0–46.0)
Hemoglobin: 8.2 g/dL — ABNORMAL LOW (ref 12.0–15.0)
MCH: 21.5 pg — ABNORMAL LOW (ref 26.0–34.0)
MCHC: 32.7 g/dL (ref 30.0–36.0)
MCV: 65.7 fL — ABNORMAL LOW (ref 80.0–100.0)
Platelets: 201 10*3/uL (ref 150–400)
RBC: 3.82 MIL/uL — ABNORMAL LOW (ref 3.87–5.11)
RDW: 15.3 % (ref 11.5–15.5)
WBC: 16.3 10*3/uL — ABNORMAL HIGH (ref 4.0–10.5)
nRBC: 0 % (ref 0.0–0.2)

## 2021-07-20 MED ORDER — FERROUS SULFATE 325 (65 FE) MG PO TABS
325.0000 mg | ORAL_TABLET | ORAL | Status: DC
  Administered 2021-07-20: 325 mg via ORAL
  Filled 2021-07-20: qty 1

## 2021-07-20 MED ORDER — INFLUENZA VAC SPLIT QUAD 0.5 ML IM SUSY
0.5000 mL | PREFILLED_SYRINGE | INTRAMUSCULAR | Status: AC
  Administered 2021-07-21: 0.5 mL via INTRAMUSCULAR
  Filled 2021-07-20: qty 0.5

## 2021-07-20 MED ORDER — ASCORBIC ACID 500 MG PO TABS
500.0000 mg | ORAL_TABLET | Freq: Every day | ORAL | Status: DC
  Administered 2021-07-20 – 2021-07-22 (×3): 500 mg via ORAL
  Filled 2021-07-20 (×3): qty 1

## 2021-07-20 NOTE — Progress Notes (Signed)
CSW educated MOB about PPD. CSW informed MOB of possible supports and interventions to decrease PPD.  CSW also encouraged MOB to seek medical attention if needed for increased signs and symptoms for PPD.  Cashton Hosley, LCSW Clinical Social Worker  

## 2021-07-20 NOTE — Progress Notes (Signed)
Patient ID: Sarah Copeland, female   DOB: [birth year], 25 y.o.   MRN: [id number]  POSTPARTUM PROGRESS NOTE  Post Partum Day 1  Subjective:  Sarah Copeland is a 25 y.o. Y4M2500 s/p Repeat C/S at [redacted]w[redacted]d.  No acute events overnight.  Pt denies problems with ambulating, voiding or po intake.  She denies nausea or vomiting.  Pain is well controlled.  She has had flatus. She has not had bowel movement.  Lochia Minimal.   Objective: Blood pressure 131/72, pulse (!) 59, temperature 98.1 F (36.7 C), temperature source Oral, resp. rate 18, height 5\' 7"  (1.702 m), weight 99.8 kg, last menstrual period 10/20/2020, SpO2 100 %, unknown if currently breastfeeding.  Physical Exam:  General: alert, cooperative and no distress Chest: no respiratory distress Heart:regular rate, distal pulses intact Abdomen: soft, nontender,  Uterine Fundus: firm, appropriately tender DVT Evaluation: No calf swelling or tenderness Extremities: Negative edema Skin: warm, dry. Honey comb dressing clean, dry, intact.   Recent Labs    07/19/21 1805 07/20/21 0430  HGB 8.8* 8.2*  HCT 26.6* 25.1*    Assessment/Plan: Sarah Copeland is a 25 y.o. [phone] s/p Repeat C/S at [redacted]w[redacted]d   PPD# 1- Doing well Contraception: Declines  Feeding: breast  Dispo: Plan for discharge tomorrow # PO iron & Vit C started today.  # Anemia/asymptomatic     LOS: 2 day   [redacted]w[redacted]d, NP 07/20/2021, 10:38 AM

## 2021-07-21 NOTE — Progress Notes (Signed)
Subjective: Postpartum Day 2: Cesarean Delivery Patient reports + flatus and no problems voiding.   Breast becoming engorged. Bottle feeding, but considering breastfeeding.   Objective: Vital signs in last 24 hours: Temp:  [98.1 F (36.7 C)-98.5 F (36.9 C)] 98.5 F (36.9 C) (10/30 0421) Pulse Rate:  [71-84] 84 (10/30 0421) Resp:  [18-19] 18 (10/30 0421) BP: (118-121)/(64-74) 118/70 (10/30 0421)  Physical Exam:  General: alert, cooperative, appears stated age, and mild distress Lochia: appropriate Uterine Fundus: firm Incision: healing well, no significant drainage DVT Evaluation: No evidence of DVT seen on physical exam.  Recent Labs    07/19/21 1805 07/20/21 0430  HGB 8.8* 8.2*  HCT 26.6* 25.1*    Assessment/Plan: Status post Cesarean section. Doing well postoperatively.  Continue current care. Declines BC.  Wants to try breastfeeding. [facility] consult.   Dorathy Kinsman 07/21/2021, 11:42 AM

## 2021-07-21 NOTE — Lactation Note (Addendum)
This note was copied from a baby's chart. Lactation Consultation Note  Patient Name: Sarah Copeland AYTKZ'S Date: 07/21/2021 Reason for consult: Initial assessment;Mother's request;Difficult latch;1st time breastfeeding;Term;Other (Comment);Breastfeeding assistance (Iron Def ( ferrous sulfate)) Age:24 hours  Mom had recent feeding with formula ( 50 ml) just before LC arrival.  LC reviewed feeding positions and how to get a deep latch. BF supplementation guide provided.   After setting up DEBP, infant began to cue so we worked on latching infant did a few sucks and came off.   Mom pumped for first time getting 60 ml with one session.  Mom aware to use pump to relieve fullness post pumping for comfort on maintenance phase if infant not able to latch.   Plan 1. To feed based on cues 8-12x 24 hr period. Mom to offer breasts first and look for signs of milk transfer.  2. Mom to supplement with eBM first followed by formula with slow flow nipple and pace bottle feeding. BF supplementation guide provided.  3. DEBP q 3hrs on maintenance setting for 20 min.  4. I and O sheet reviewed.   Mom to call for latch assistance with next feeding.   All questions answered at the end of the visit.   Maternal Data Has patient been taught Hand Expression?: Yes Does the patient have breastfeeding experience prior to this delivery?: No  Feeding Mother's Current Feeding Choice: Breast Milk and Formula Nipple Type: Slow - flow  LATCH Score                    Lactation Tools Discussed/Used Tools: Pump;Flanges Flange Size: 24 Breast pump type: Double-Electric Breast Pump Pump Education: Setup, frequency, and cleaning;Milk Storage Reason for Pumping: increase stimulation Pumping frequency: every 3 hrs for 15 min  Interventions Interventions: Breast feeding basics reviewed;Skin to skin;Hand express;Breast compression;Position options;Expressed milk;DEBP;Education;Pace feeding;LC  Psychologist, educational;Visual merchandiser education  Discharge WIC Program: Yes  Consult Status Consult Status: Follow-up Date: 07/22/21 Follow-up type: In-patient    Sarah Copeland  Sarah Copeland 07/21/2021, 6:01 PM

## 2021-07-22 MED ORDER — OXYCODONE HCL 5 MG PO TABS: 5.0000 mg | ORAL_TABLET | ORAL | 0 refills | Status: AC | PRN

## 2021-07-22 MED ORDER — SODIUM CHLORIDE 0.9 % IV SOLN
500.0000 mg | Freq: Once | INTRAVENOUS | Status: AC
  Administered 2021-07-22: 500 mg via INTRAVENOUS
  Filled 2021-07-22: qty 25

## 2021-07-22 MED ORDER — ACETAMINOPHEN 325 MG PO TABS: 650.0000 mg | ORAL_TABLET | Freq: Four times a day (QID) | ORAL | Status: DC

## 2021-07-22 MED ORDER — ASCORBIC ACID 500 MG PO TABS: 500.0000 mg | ORAL_TABLET | Freq: Every day | ORAL | Status: DC

## 2021-07-22 MED ORDER — FERROUS SULFATE 325 (65 FE) MG PO TABS: 325.0000 mg | ORAL_TABLET | ORAL | 0 refills | Status: DC

## 2021-07-22 MED ORDER — SODIUM CHLORIDE 0.9 % IV SOLN: Freq: Once | INTRAVENOUS | Status: AC
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
## 2021-07-22 NOTE — Lactation Note (Addendum)
This note was copied from a baby's chart. Lactation Consultation Note  Patient Name: Sarah Copeland HEKBT'C Date: 07/22/2021 Reason for consult: Follow-up assessment;1st time breastfeeding Age:23 hours  P3, Baby eager showing feeding cues.  Mother recently pumped 40 ml.  Reviewed milk storage. Assisted with latching baby in cross cradle hold. Encouraged offering breast before bottle to practice latching.  Reviewed engorgement care since breasts are filling. Pacifier use not recommended at this time.   Feeding Mother's Current Feeding Choice: Breast Milk and Formula  LATCH Score Latch: Repeated attempts needed to sustain latch, nipple held in mouth throughout feeding, stimulation needed to elicit sucking reflex.  Audible Swallowing: A few with stimulation  Type of Nipple: Everted at rest and after stimulation  Comfort (Breast/Nipple): Soft / non-tender  Hold (Positioning): Assistance needed to correctly position infant at breast and maintain latch.  LATCH Score: 7   Lactation Tools Discussed/Used  DEBP, manual pump  Interventions Interventions: Breast feeding basics reviewed;Assisted with latch;Skin to skin;Hand express;Adjust position;Support pillows;Education  Discharge Discharge Education: Engorgement and breast care Pump:  (Advised to call [facility]) WIC Program: Yes  Consult Status Consult Status: Follow-up Date: 07/23/21 Follow-up type: In-patient    Dahlia Byes [facility] 07/22/2021, 9:55 AM

## 2021-07-22 NOTE — Discharge Summary (Signed)
Postpartum Discharge Summary    Patient Name: Sarah Copeland DOB: 25-Feb-1996 MRN: [id number]  Date of admission: 07/19/2021 Delivery date:07/19/2021  Delivering provider: Caren Macadam  Date of discharge: 07/22/2021  Admitting diagnosis: S/P repeat low transverse C-section [Z98.891] Intrauterine pregnancy: [redacted]w[redacted]d    Secondary diagnosis:  Active Problems:   Supervision of other normal pregnancy, antepartum   Hx of preeclampsia, prior pregnancy, currently pregnant   Group B streptococcal bacteriuria   Genetic carrier   S/P repeat low transverse C-section  Additional problems: anemia d/t blood loss/pregnancy    Discharge diagnosis: Term Pregnancy Delivered                                              Post partum procedures: IV iron Augmentation:  Complications:   Hospital course: Sceduled C/S   25y.o. yo G3P3003 at 362w0das admitted to the hospital 07/19/2021 for scheduled cesarean section with the following indication:Elective Repeat.Delivery details are as follows:  Membrane Rupture Time/Date: 10:29 AM ,07/19/2021   Delivery Method:C-Section, Vacuum Assisted  Details of operation can be found in separate operative note.  Patient had an uncomplicated postpartum course.  She is ambulating, tolerating a regular diet, passing flatus, and urinating well. Patient is discharged home in stable condition on  07/22/21        Newborn Data: Birth date:07/19/2021  Birth time:10:31 AM  Gender:Female  Living status:Living  Apgars:8 ,9  Weight:2990 g     Magnesium Sulfate received: No BMZ received: No Rhophylac:N/A MMR:N/A T-DaP:Given prenatally Flu: decline Transfusion:No  Physical exam  Vitals:   07/21/21 1357 07/21/21 2200 07/22/21 0530 07/22/21 0925  BP: 133/81 121/71 120/76 120/87  Pulse:  97 78 72  Resp: _0 Temp: 98 F (36.7 C) 98 F (36.7 C) 98 F (36.7 C) 98.3 F (36.8 C)  TempSrc: Axillary Axillary  Oral  SpO2:  100% 100%   Weight:       Height:       General: alert, cooperative, and no distress Lochia: appropriate Uterine Fundus: firm Incision: Healing well with no significant drainage DVT Evaluation: No evidence of DVT seen on physical exam. Labs: Lab Results  Component Value Date   WBC 16.3 (H) 07/20/2021   HGB 8.2 (L) 07/20/2021   HCT 25.1 (L) 07/20/2021   MCV 65.7 (L) 07/20/2021   PLT 201 07/20/2021   CMP Latest Ref Rng & Units 03/01/2021  Glucose 70 - 99 mg/dL 77  BUN 6 - 20 mg/dL 7  Creatinine 0.44 - 1.00 mg/dL 0.50  Sodium 135 - 145 mmol/L 134(L)  Potassium 3.5 - 5.1 mmol/L 3.3(L)  Chloride 98 - 111 mmol/L 105  CO2 22 - 32 mmol/L 20(L)  Calcium 8.9 - 10.3 mg/dL 9.4  Total Protein 6.5 - 8.1 g/dL 7.3  Total Bilirubin 0.3 - 1.2 mg/dL 0.4  Alkaline Phos 38 - 126 U/L 68  AST 15 - 41 U/L 16  ALT 0 - 44 U/L 13   Edinburgh Score: Edinburgh Postnatal Depression Scale Screening Tool 07/20/2021  I have been able to laugh and see the funny side of things. 0  I have looked forward with enjoyment to things. 0  I have blamed myself unnecessarily when things went wrong. 0  I have been anxious or worried for no good reason. 0  I have felt scared or panicky  for no good reason. 0  Things have been getting on top of me. 0  I have been so unhappy that I have had difficulty sleeping. 0  I have felt sad or miserable. 0  I have been so unhappy that I have been crying. 0  The thought of harming myself has occurred to me. 0  Edinburgh Postnatal Depression Scale Total 0     After visit meds:  Allergies as of 07/22/2021       Reactions   Amoxicillin Anaphylaxis        Medication List     STOP taking these medications    aspirin 81 MG chewable tablet   cyclobenzaprine 7.5 MG tablet Commonly known as: FEXMID   famotidine 20 MG tablet Commonly known as: PEPCID   metoCLOPramide 10 MG tablet Commonly known as: REGLAN       TAKE these medications    acetaminophen 325 MG tablet Commonly known as:  TYLENOL Take 2 tablets (650 mg total) by mouth every 6 (six) hours.   ascorbic acid 500 MG tablet Commonly known as: VITAMIN C Take 1 tablet (500 mg total) by mouth daily.   ferrous sulfate 325 (65 FE) MG tablet Take 1 tablet (325 mg total) by mouth every other day.   oxyCODONE 5 MG immediate release tablet Commonly known as: Oxy IR/ROXICODONE Take 1 tablet (5 mg total) by mouth every 4 (four) hours as needed for up to 5 days for severe pain.   prenatal vitamin w/FE, FA 27-1 MG Tabs tablet Take 1 tablet by mouth daily at 12 noon. What changed: when to take this         Discharge home in stable condition Infant Feeding: Breast Infant Disposition:home with mother Discharge instruction: per After Visit Summary and Postpartum booklet. Activity: Advance as tolerated. Pelvic rest for 6 weeks.  Diet: routine diet Future Appointments: Future Appointments  Date Time Provider [place]  07/29/2021  2:00 PM [facility] NURSE [facility]  08/22/2021  1:35 PM Anyanwu, Sallyanne Havers, MD [facility]     07/22/2021 Christin Fudge, CNM

## 2021-07-22 NOTE — Progress Notes (Signed)
Per CPS worker Renaye Rakers, there ar no barriers to infant discharging to MOB when infant is medically ready.  CSW updated medical team.   Blaine Hamper, MSW, LCSW Clinical Social Work [phone]

## 2021-07-22 NOTE — Progress Notes (Signed)
Pt reported to me at 0925 that she fell in the bathroom this morning.  She stated she was sitting on the toilet and used her arm to help herself stand but it was weak and she fell onto the right side onto the floor.  She also stated that she was a little dizzy when she stood up.   Assessment was done along with VS.  They were within normal limits. Post Fall protocol was initiated.  Jacklyn Shell CNM was notified at 13.  Iron infusion orders were received.  Will continue to closely monitor pt.

## 2021-07-29 ENCOUNTER — Ambulatory Visit: Payer: Medicaid Other

## 2021-07-31 ENCOUNTER — Other Ambulatory Visit: Payer: Self-pay

## 2021-07-31 ENCOUNTER — Ambulatory Visit (INDEPENDENT_AMBULATORY_CARE_PROVIDER_SITE_OTHER): Payer: Medicaid Other

## 2021-07-31 NOTE — Progress Notes (Signed)
Pt here today for wound check. Pt had C-section on 07/19/2021.  Pt denies any drainage, redness or fever to site.   Incision today is clean, dry, intact and well approximated. Pt removed surgical dressing at home on Wed. Pt tolerated well.   Pt advised to keep clean and dry. Pt verbalized understanding.   Pt has scheduled PP visit on 08/22/21.   Judeth Cornfield, RN

## 2021-08-01 ENCOUNTER — Telehealth (HOSPITAL_COMMUNITY): Payer: Self-pay | Admitting: *Deleted

## 2021-08-01 NOTE — Telephone Encounter (Signed)
Mom reports feeling good and incision is healing well. Had PP visit yesterday. EPDS=0 (hospital score=0) Reports no concerns about herself at present. Mom reports baby is doing well. Feeding, peeing, and pooping without difficulty. Sleeps on back in crib. Reviewed safe sleep. Mom reports no concerns regarding baby at present.  Duffy Rhody, RN 08-01-2021 at 11:45am

## 2021-08-21 ENCOUNTER — Other Ambulatory Visit: Payer: Self-pay

## 2021-08-21 ENCOUNTER — Encounter (HOSPITAL_COMMUNITY): Payer: Self-pay

## 2021-08-21 ENCOUNTER — Emergency Department (HOSPITAL_COMMUNITY)
Admission: EM | Admit: 2021-08-21 | Discharge: 2021-08-22 | Disposition: A | Discharge status: Home / Self Care | Payer: Medicaid Other | Attending: Student | Admitting: Student

## 2021-08-21 DIAGNOSIS — J029 Acute pharyngitis, unspecified: Secondary | ICD-10-CM | POA: Insufficient documentation

## 2021-08-21 DIAGNOSIS — Z20822 Contact with and (suspected) exposure to covid-19: Secondary | ICD-10-CM | POA: Diagnosis not present

## 2021-08-21 DIAGNOSIS — Z5321 Procedure and treatment not carried out due to patient leaving prior to being seen by health care provider: Secondary | ICD-10-CM | POA: Insufficient documentation

## 2021-08-21 DIAGNOSIS — R059 Cough, unspecified: Secondary | ICD-10-CM | POA: Insufficient documentation

## 2021-08-21 LAB — RESP PANEL BY RT-PCR (FLU A&B, COVID) ARPGX2
Influenza A by PCR: NEGATIVE
Influenza B by PCR: NEGATIVE
SARS Coronavirus 2 by RT PCR: NEGATIVE

## 2021-08-21 LAB — GROUP A STREP BY PCR: Group A Strep by PCR: NOT DETECTED

## 2021-08-21 NOTE — ED Provider Notes (Signed)
Emergency Medicine Provider Triage Evaluation Note  Sarah Copeland , a 25 y.o. female  was evaluated in triage.  Pt complains of sore throat times several days.  Denies any drooling, has been able to eat, pain with swallowing.  No fevers or chills.  Has good developed a cough and nasal congestion today.  No chest pain, shortness of breath, weakness  Review of Systems  Positive: As above Negative: As above  Physical Exam  BP (!) 134/107 (BP Location: Left Arm)   Pulse 79   Temp 98.8 F (37.1 C) (Oral)   Resp 16   Ht 5\' 7"  (1.702 m)   Wt 93.4 kg   LMP  (LMP Unknown)   SpO2 96%   BMI 32.26 kg/m  Gen:   Awake, no distress   Resp:  Normal effort  MSK:   Moves extremities without difficulty  Other:  Mild erythema to the posterior pharynx.  Uvula midline, no tripoding, no sublingual swelling, tongue normal.  No drooling.  Speaking full sentences, lung sounds clear.  Medical Decision Making  Medically screening exam initiated at 10:37 PM.  Appropriate orders placed.  [facility] was informed that the remainder of the evaluation will be completed by another provider, this initial triage assessment does not replace that evaluation, and the importance of remaining in the ED until their evaluation is complete.     [facility], PA-C 08/21/21 2238    2239, MD 08/21/21 [phone]

## 2021-08-21 NOTE — ED Triage Notes (Signed)
Pt reports nonproductive cough and sore throat x 2 days.

## 2021-08-22 ENCOUNTER — Ambulatory Visit: Payer: Medicaid Other | Admitting: Obstetrics & Gynecology

## 2021-08-23 ENCOUNTER — Encounter: Payer: Self-pay | Admitting: Family Medicine

## 2021-08-23 ENCOUNTER — Encounter: Payer: Self-pay | Admitting: Radiology

## 2021-08-25 ENCOUNTER — Ambulatory Visit (HOSPITAL_COMMUNITY)
Admission: EM | Admit: 2021-08-25 | Discharge: 2021-08-25 | Disposition: A | Discharge status: Home / Self Care | Payer: Medicaid Other | Attending: Physician Assistant | Admitting: Physician Assistant

## 2021-08-25 ENCOUNTER — Encounter (HOSPITAL_COMMUNITY): Payer: Self-pay | Admitting: Emergency Medicine

## 2021-08-25 ENCOUNTER — Other Ambulatory Visit: Payer: Self-pay

## 2021-08-25 DIAGNOSIS — R490 Dysphonia: Secondary | ICD-10-CM

## 2021-08-25 DIAGNOSIS — J209 Acute bronchitis, unspecified: Secondary | ICD-10-CM | POA: Diagnosis not present

## 2021-08-25 DIAGNOSIS — R051 Acute cough: Secondary | ICD-10-CM

## 2021-08-25 DIAGNOSIS — J029 Acute pharyngitis, unspecified: Secondary | ICD-10-CM

## 2021-08-25 MED ORDER — PREDNISONE 20 MG PO TABS: 40.0000 mg | ORAL_TABLET | Freq: Every day | ORAL | 0 refills | Status: AC

## 2021-08-25 NOTE — ED Triage Notes (Signed)
Sore throat for a week.cough for one week.  Cough worsens at night.  Reports a dry cough.  Denies fever.  Patient seen in ED 08/21/2021.  Left without being seen.

## 2021-08-25 NOTE — ED Provider Notes (Signed)
[facility]    CSN: [id number] Arrival date & time: 08/25/21  1631      History   Chief Complaint Chief Complaint  Patient presents with   Cough    HPI Sarah Copeland is a 25 y.o. female.   Patient presents today with a 1 week history of URI symptoms including sore throat, cough, hoarseness.  Reports associated dry throat and irritation.  She denies any fever, nausea, vomiting, body aches, shortness of breath, chest pain.  She was seen in the emergency room on 08/21/2021 at which point she tested negative for COVID, strep, influenza.  She was not fully evaluated and was not prescribed any medication.  She has been using over-the-counter medicine including Tylenol, Robitussin, NyQuil, Vicks VapoRub without improvement of symptoms.  Denies any recent antibiotic use.  She is eating and drinking normally despite symptoms.  She has had her COVID-19 and influenza vaccines.  She recently had a baby approximately 5 weeks ago but is not breast-feeding.   Past Medical History:  Diagnosis Date   Anemia    Anxiety    Depression    stressed, going through custody battle   Headache    Kidney stones    Medical history non-contributory    Preeclampsia 2018   Vaginal Pap smear, abnormal    f/u PP    Patient Active Problem List   Diagnosis Date Noted   S/P repeat low transverse C-section 07/19/2021   Vaginal yeast infection 04/23/2021   Genetic carrier 03/29/2021   Group B streptococcal bacteriuria 02/25/2021   Supervision of other normal pregnancy, antepartum 12/31/2020   History of 3 cesarean sections 12/31/2020   Hx of preeclampsia, prior pregnancy, currently pregnant 12/31/2020    Past Surgical History:  Procedure Laterality Date   CESAREAN SECTION     CESAREAN SECTION N/A 07/19/2021   Procedure: CESAREAN SECTION;  Surgeon: Federico Flake, MD;  Location: [facility];  Service: Obstetrics;  Laterality: N/A;    OB History     Gravida  3   Para  3   Term   3   Preterm      AB      Living  3      SAB      IAB      Ectopic      Multiple  0   Live Births  3        Obstetric Comments  1- Pre E 2- wasn't dilating          Home Medications    Prior to Admission medications   Medication Sig Start Date End Date Taking? Authorizing Provider  predniSONE (DELTASONE) 20 MG tablet Take 2 tablets (40 mg total) by mouth daily with breakfast for 4 days. 08/25/21 08/29/21 Yes Mace Weinberg, Noberto Retort, PA-C  acetaminophen (TYLENOL) 325 MG tablet Take 2 tablets (650 mg total) by mouth every 6 (six) hours. 07/22/21   Cresenzo-Dishmon, Scarlette Calico, CNM  ascorbic acid (VITAMIN C) 500 MG tablet Take 1 tablet (500 mg total) by mouth daily. 07/22/21   Cresenzo-Dishmon, Scarlette Calico, CNM  ferrous sulfate 325 (65 FE) MG tablet Take 1 tablet (325 mg total) by mouth every other day. 07/22/21   Cresenzo-Dishmon, Scarlette Calico, CNM  prenatal vitamin w/FE, FA (PRENATAL 1 + 1) 27-1 MG TABS tablet Take 1 tablet by mouth daily at 12 noon. Patient taking differently: Take 1 tablet by mouth in the morning. 12/31/20   Warden Fillers, MD    Family History Family History  Problem Relation Age of Onset   Depression Mother    Hypertension Mother    Bipolar disorder Mother    Schizophrenia Mother    Kidney disease Father        59- kidney failure    Social History Social History   Tobacco Use   Smoking status: Former    Types: Cigars    Quit date: 06/13/2021    Years since quitting: 0.2   Smokeless tobacco: Never   Tobacco comments:    black and mild  Vaping Use   Vaping Use: Never used  Substance Use Topics   Alcohol use: Never   Drug use: Never     Allergies   Amoxicillin   Review of Systems Review of Systems  Constitutional:  Positive for activity change. Negative for appetite change, fatigue and fever.  HENT:  Positive for congestion and sore throat. Negative for sinus pressure and sneezing.   Respiratory:  Positive for cough. Negative for shortness  of breath.   Cardiovascular:  Negative for chest pain.  Gastrointestinal:  Negative for abdominal pain, diarrhea, nausea and vomiting.  Musculoskeletal:  Negative for arthralgias and myalgias.  Neurological:  Negative for dizziness, light-headedness and headaches.    Physical Exam Triage Vital Signs ED Triage Vitals  Enc Vitals Group     BP 08/25/21 1851 (!) 152/95     Pulse Rate 08/25/21 1851 64     Resp 08/25/21 1851 20     Temp 08/25/21 1851 98.8 F (37.1 C)     Temp Source 08/25/21 1851 Oral     SpO2 08/25/21 1851 97 %     Weight --      Height --      Head Circumference --      Peak Flow --      Pain Score 08/25/21 1845 10     Pain Loc --      Pain Edu? --      Excl. in GC? --    No data found.  Updated Vital Signs BP (!) 152/95 (BP Location: Right Arm)   Pulse 64   Temp 98.8 F (37.1 C) (Oral)   Resp 20   LMP  (LMP Unknown)   SpO2 97%   Visual Acuity Right Eye Distance:   Left Eye Distance:   Bilateral Distance:    Right Eye Near:   Left Eye Near:    Bilateral Near:     Physical Exam Vitals reviewed.  Constitutional:      General: She is awake. She is not in acute distress.    Appearance: Normal appearance. She is well-developed. She is not ill-appearing.     Comments: Very pleasant female appears stated age in no acute distress sitting comfortably in exam room  HENT:     Head: Normocephalic and atraumatic.     Right Ear: Tympanic membrane, ear canal and external ear normal. Tympanic membrane is not erythematous or bulging.     Left Ear: Tympanic membrane, ear canal and external ear normal. Tympanic membrane is not erythematous or bulging.     Nose:     Right Sinus: No maxillary sinus tenderness or frontal sinus tenderness.     Left Sinus: No maxillary sinus tenderness or frontal sinus tenderness.     Mouth/Throat:     Pharynx: Uvula midline. Posterior oropharyngeal erythema present. No oropharyngeal exudate.  Cardiovascular:     Rate and Rhythm:  Normal rate and regular rhythm.     Heart sounds: Normal heart  sounds, S1 normal and S2 normal. No murmur heard. Pulmonary:     Effort: Pulmonary effort is normal.     Breath sounds: Normal breath sounds. No wheezing, rhonchi or rales.     Comments: Clear to auscultation bilaterally Psychiatric:        Behavior: Behavior is cooperative.     UC Treatments / Results  Labs (all labs ordered are listed, but only abnormal results are displayed) Labs Reviewed - No data to display  EKG   Radiology No results found.  Procedures Procedures (including critical care time)  Medications Ordered in UC Medications - No data to display  Initial Impression / Assessment and Plan / UC Course  I have reviewed the triage vital signs and the nursing notes.  Pertinent labs & imaging results that were available during my care of the patient were reviewed by me and considered in my medical decision making (see chart for details).     No indication for viral testing given patient is been symptomatic for over a week and had negative testing in the emergency room with symptom onset.  Discussed likely viral bronchitis as etiology.  No evidence of acute infection that warrant initiation of antibiotics today.  Patient is not currently breast-feeding so we will start prednisone burst with instruction not to take NSAIDs due to risk of GI bleeding.  Recommend she use over-the-counter medication for additional symptom relief including Tylenol, Mucinex, Flonase.  She is to rest and drink plenty of fluid.  Discussed alarm symptoms that warrant emergent evaluation.  Strict return precautions given to which she expressed understanding.  Final Clinical Impressions(s) / UC Diagnoses   Final diagnoses:  Acute bronchitis, unspecified organism  Acute cough  Hoarseness  Sore throat     Discharge Instructions      Start prednisone to help with pain and inflammation.  Do not take NSAIDs including aspirin,  ibuprofen/Advil, naproxen/Aleve with this medication as it can cause stomach bleeding.  Make sure to use a humidifier as well as lots of fluids.  If anything worsens you are having difficulty speaking, difficulty swallowing, shortness of breath please return for reevaluation as we discussed.    ED Prescriptions     Medication Sig Dispense Auth. Provider   predniSONE (DELTASONE) 20 MG tablet Take 2 tablets (40 mg total) by mouth daily with breakfast for 4 days. 8 tablet Tylee Yum, Noberto Retort, PA-C      PDMP not reviewed this encounter.   Jeani Hawking, PA-C 08/25/21 1934

## 2021-08-25 NOTE — Discharge Instructions (Signed)
Start prednisone to help with pain and inflammation.  Do not take NSAIDs including aspirin, ibuprofen/Advil, naproxen/Aleve with this medication as it can cause stomach bleeding.  Make sure to use a humidifier as well as lots of fluids.  If anything worsens you are having difficulty speaking, difficulty swallowing, shortness of breath please return for reevaluation as we discussed.

## 2021-09-02 ENCOUNTER — Telehealth: Payer: Self-pay

## 2021-09-02 NOTE — Telephone Encounter (Signed)
[place] with [employer] called to verify date and type of delivery. Requests call back at [phone]. Returned phone call; VM left on confidential VM with date and type of delivery.

## 2021-09-04 ENCOUNTER — Other Ambulatory Visit: Payer: Self-pay

## 2021-09-04 ENCOUNTER — Encounter: Payer: Self-pay | Admitting: Certified Nurse Midwife

## 2021-09-04 ENCOUNTER — Ambulatory Visit: Payer: Medicaid Other | Admitting: Certified Nurse Midwife

## 2021-09-04 ENCOUNTER — Ambulatory Visit (INDEPENDENT_AMBULATORY_CARE_PROVIDER_SITE_OTHER): Payer: Medicaid Other | Admitting: Certified Nurse Midwife

## 2021-09-04 DIAGNOSIS — Z3009 Encounter for other general counseling and advice on contraception: Secondary | ICD-10-CM

## 2021-09-04 DIAGNOSIS — Z5941 Food insecurity: Secondary | ICD-10-CM

## 2021-09-04 DIAGNOSIS — D508 Other iron deficiency anemias: Secondary | ICD-10-CM

## 2021-09-04 MED ORDER — FERROUS SULFATE 325 (65 FE) MG PO TABS: 325.0000 mg | ORAL_TABLET | ORAL | 4 refills | Status: DC

## 2021-09-04 MED ORDER — NORGESTIMATE-ETH ESTRADIOL 0.25-35 MG-MCG PO TABS: 1.0000 | ORAL_TABLET | Freq: Every day | ORAL | 11 refills | Status: DC

## 2021-09-04 NOTE — Progress Notes (Signed)
Postpartum Visit Note  Sarah Copeland is a 25 y.o. [id number] female who presents for a postpartum visit. She is 6 weeks postpartum following a repeat cesarean section.  I have fully reviewed the prenatal and intrapartum course. The delivery was at 39 gestational weeks.  Anesthesia: spinal. Postpartum course has been uncomplicated. Baby is doing well. Baby is feeding by bottle - Gerber Gentle . Bleeding changing a panty liner every 1 hours. Bowel function is normal. Bladder function is normal. Patient is not sexually active. Contraception method is none. Postpartum depression screening: negative.  Upstream - 09/04/21 2001       Pregnancy Intention Screening   Does the patient want to become pregnant in the next year? No    Would the patient like to discuss contraceptive options today? Yes      Contraception Wrap Up   Current Method No Contraceptive Precautions    End Method Oral Contraceptive    Contraception Counseling Provided Yes            The pregnancy intention screening data noted above was reviewed. Potential methods of contraception were discussed. The patient elected to proceed with Oral Contraceptive.   Edinburgh Postnatal Depression Scale - 09/04/21 1434       Edinburgh Postnatal Depression Scale:  In the Past 7 Days   I have been able to laugh and see the funny side of things. 0    I have looked forward with enjoyment to things. 0    I have blamed myself unnecessarily when things went wrong. 0    I have been anxious or worried for no good reason. 2    I have felt scared or panicky for no good reason. 0    Things have been getting on top of me. 0    I have been so unhappy that I have had difficulty sleeping. 0    I have felt sad or miserable. 0    I have been so unhappy that I have been crying. 0    The thought of harming myself has occurred to me. 0    Edinburgh Postnatal Depression Scale Total 2            Health Maintenance Due  Topic Date Due    Pneumococcal Vaccine 51-6 Years old (1 - PCV) Never done   HPV VACCINES (1 - 2-dose series) Never done   COVID-19 Vaccine (3 - Booster for Moderna series) 08/23/2020   The following portions of the patient's history were reviewed and updated as appropriate: allergies, current medications, past family history, past medical history, past social history, past surgical history, and problem list.  Review of Systems Pertinent items noted in HPI and remainder of comprehensive ROS otherwise negative.  Objective:  BP 120/75    Pulse 93    Wt 210 lb (95.3 kg)    LMP 09/03/2021 (Approximate)    Breastfeeding No    BMI 32.89 kg/m    General:  alert, cooperative, and appears stated age   Breasts:  not indicated  Lungs:  Normal effort  Heart:  regular rate and rhythm  Abdomen: Soft, non tender    Wound well approximated incision  GU exam:  not indicated       Assessment:  Postpartum care following cesarean delivery  Food insecurity - Plan: AMBULATORY REFERRAL TO [facility]  Counseling for birth control, oral contraceptives - Plan: norgestimate-ethinyl estradiol (ORTHO-CYCLEN) 0.25-35 MG-MCG tablet  Iron deficiency anemia secondary to inadequate dietary iron intake -  Plan: ferrous sulfate 325 (65 FE) MG tablet   Plan:   Essential components of care per ACOG recommendations:  1.  Mood and well being: Patient with negative depression screening today. Reviewed local resources for support.  - Patient tobacco use? No.   - hx of drug use? No.    2. Infant care and feeding:  -Patient currently breastmilk feeding? No.  -Social determinants of health (SDOH) reviewed in EPIC. No concerns  3. Sexuality, contraception and birth spacing - Patient does not want a pregnancy in the next year.  Desired family size is 3 children.  - Reviewed forms of contraception in tiered fashion. Patient desired oral contraceptives (estrogen/progesterone) today.   - Discussed birth spacing of 18 months  4.  Sleep and fatigue -Encouraged family/partner/community support of 4 hrs of uninterrupted sleep to help with mood and fatigue  5. Physical Recovery  - Discussed patients delivery and complications. She describes her labor as good. - Patient had a C-section.  - Patient has urinary incontinence? No. - Patient is safe to resume physical and sexual activity  6.  Health Maintenance - HM due items addressed Yes - Last pap smear  Diagnosis  Date Value Ref Range Status  12/31/2020 (A)  Final   - Atypical squamous cells of undetermined significance (ASC-US)   Pap smear not done at today's visit. Needs repeat pap 12/31/2021 -Breast Cancer screening indicated? No.   7. Chronic Disease/Pregnancy Condition follow up: None - PCP follow up as needed  Bernerd Limbo, CNM [facility] [employer], [facility]

## 2021-09-05 ENCOUNTER — Telehealth: Payer: Self-pay | Admitting: General Practice

## 2021-09-05 NOTE — Telephone Encounter (Signed)
[place] from [employer] called and left message on nurse voicemail line stating they need to confirm when the patient was taken out of work. The date they have on file is 10/21 and they are calling to confirm.  Returned phone call, no answer- left message stating per chart review, patient was taken out of work 10/28. They may call back if they have further questions.

## 2021-10-04 DIAGNOSIS — D649 Anemia, unspecified: Secondary | ICD-10-CM | POA: Insufficient documentation

## 2021-10-05 DIAGNOSIS — R87610 Atypical squamous cells of undetermined significance on cytologic smear of cervix (ASC-US): Secondary | ICD-10-CM | POA: Insufficient documentation

## 2021-10-21 ENCOUNTER — Ambulatory Visit (HOSPITAL_COMMUNITY)
Admission: EM | Admit: 2021-10-21 | Discharge: 2021-10-21 | Disposition: A | Discharge status: Home / Self Care | Payer: Medicaid Other | Attending: Family Medicine | Admitting: Family Medicine

## 2021-10-21 ENCOUNTER — Other Ambulatory Visit: Payer: Self-pay

## 2021-10-21 ENCOUNTER — Encounter (HOSPITAL_COMMUNITY): Payer: Self-pay | Admitting: *Deleted

## 2021-10-21 DIAGNOSIS — N898 Other specified noninflammatory disorders of vagina: Secondary | ICD-10-CM

## 2021-10-21 DIAGNOSIS — B3731 Acute candidiasis of vulva and vagina: Secondary | ICD-10-CM

## 2021-10-21 MED ORDER — FLUCONAZOLE 150 MG PO TABS: ORAL_TABLET | ORAL | 0 refills | Status: DC

## 2021-10-21 NOTE — ED Triage Notes (Signed)
Pt reports vag DC started yesterday.

## 2021-10-21 NOTE — ED Provider Notes (Signed)
[facility]    CSN: [id number] Arrival date & time: 10/21/21  9476      History   Chief Complaint Chief Complaint  Patient presents with   Vaginal Discharge   Vaginal Itching    HPI Sarah Copeland is a 26 y.o. female.   Patient is here for 1 day of vaginal itching.  This started after intercourse yesterday.   On the outside and inside of the vaginal lips;  Having white, clumpy discharge.  No dysuria.  She did have a baby 3 months ago, and using condoms since then.  She does have h/o bv, but this seems different.   She is not breast feeding.   Past Medical History:  Diagnosis Date   Anemia    Anxiety    Depression    stressed, going through custody battle   Headache    Kidney stones    Medical history non-contributory    Preeclampsia 2018   Vaginal Pap smear, abnormal    f/u PP    Patient Active Problem List   Diagnosis Date Noted   Genetic carrier 03/29/2021   Group B streptococcal bacteriuria 02/25/2021   History of 3 cesarean sections 12/31/2020    Past Surgical History:  Procedure Laterality Date   CESAREAN SECTION     CESAREAN SECTION N/A 07/19/2021   Procedure: CESAREAN SECTION;  Surgeon: Federico Flake, MD;  Location: [facility];  Service: Obstetrics;  Laterality: N/A;    OB History     Gravida  3   Para  3   Term  3   Preterm      AB      Living  3      SAB      IAB      Ectopic      Multiple  0   Live Births  3        Obstetric Comments  1- Pre E 2- wasn't dilating          Home Medications    Prior to Admission medications   Medication Sig Start Date End Date Taking? Authorizing Provider  ferrous sulfate 325 (65 FE) MG tablet Take 1 tablet (325 mg total) by mouth every other day. 09/04/21   Bernerd Limbo, CNM  norgestimate-ethinyl estradiol (ORTHO-CYCLEN) 0.25-35 MG-MCG tablet Take 1 tablet by mouth daily. 09/04/21   Bernerd Limbo, CNM    Family History Family History  Problem  Relation Age of Onset   Depression Mother    Hypertension Mother    Bipolar disorder Mother    Schizophrenia Mother    Kidney disease Father        25- kidney failure    Social History Social History   Tobacco Use   Smoking status: Former    Types: Cigars    Quit date: 06/13/2021    Years since quitting: 0.3   Smokeless tobacco: Never   Tobacco comments:    black and mild  Vaping Use   Vaping Use: Never used  Substance Use Topics   Alcohol use: Never   Drug use: Never     Allergies   Amoxicillin   Review of Systems Review of Systems  Constitutional: Negative.   HENT: Negative.    Respiratory: Negative.    Cardiovascular: Negative.   Gastrointestinal: Negative.   Genitourinary:  Positive for vaginal discharge.    Physical Exam Triage Vital Signs ED Triage Vitals  Enc Vitals Group     BP 10/21/21  1009 132/88     Pulse Rate 10/21/21 1009 74     Resp 10/21/21 1009 18     Temp 10/21/21 1009 98.3 F (36.8 C)     Temp src --      SpO2 10/21/21 1009 98 %     Weight --      Height --      Head Circumference --      Peak Flow --      Pain Score 10/21/21 1007 10     Pain Loc --      Pain Edu? --      Excl. in GC? --    No data found.  Updated Vital Signs BP 132/88    Pulse 74    Temp 98.3 F (36.8 C)    Resp 18    SpO2 98%   Visual Acuity Right Eye Distance:   Left Eye Distance:   Bilateral Distance:    Right Eye Near:   Left Eye Near:    Bilateral Near:     Physical Exam Constitutional:      Appearance: Normal appearance.  Cardiovascular:     Rate and Rhythm: Normal rate and regular rhythm.  Pulmonary:     Effort: Pulmonary effort is normal.     Breath sounds: Normal breath sounds.  Abdominal:     Palpations: Abdomen is soft.  Neurological:     Mental Status: She is alert.     UC Treatments / Results  Labs (all labs ordered are listed, but only abnormal results are displayed) Labs Reviewed - No data to  display  EKG   Radiology No results found.  Procedures Procedures (including critical care time)  Medications Ordered in UC Medications - No data to display  Initial Impression / Assessment and Plan / UC Course  I have reviewed the triage vital signs and the nursing notes.  Pertinent labs & imaging results that were available during my care of the patient were reviewed by me and considered in my medical decision making (see chart for details).   Patient seen today for vaginal itching and discharge.  Will treat for yeast today based on symptoms.  Cervical swab done and will be resulted tomorrow.  Will treat as needed based on those results.   Final Clinical Impressions(s) / UC Diagnoses   Final diagnoses:  Vaginal discharge  Vaginal yeast infection     Discharge Instructions      You were seen today for vaginal discharge and itching.  I have treated you for presumed yeast infection with oral diflucan.  You may also purchase over the counter monistat to help with external itching.  Your vaginal swab will be resulted tomorrow and we will treat if needed based on that test.     ED Prescriptions     Medication Sig Dispense Auth. Provider   fluconazole (DIFLUCAN) 150 MG tablet 1 tab po x 1, may repeat in 3 days; 2 tablet Jannifer Franklin, MD      PDMP not reviewed this encounter.   Jannifer Franklin, MD 10/21/21 1046

## 2021-10-21 NOTE — Discharge Instructions (Signed)
You were seen today for vaginal discharge and itching.  I have treated you for presumed yeast infection with oral diflucan.  You may also purchase over the counter monistat to help with external itching.  Your vaginal swab will be resulted tomorrow and we will treat if needed based on that test.

## 2021-10-22 LAB — CERVICOVAGINAL ANCILLARY ONLY
Bacterial Vaginitis (gardnerella): NEGATIVE
Candida Glabrata: NEGATIVE
Candida Vaginitis: POSITIVE — AB
Chlamydia: NEGATIVE
Comment: NEGATIVE
Comment: NEGATIVE
Comment: NEGATIVE
Comment: NEGATIVE
Comment: NEGATIVE
Comment: NORMAL
Neisseria Gonorrhea: NEGATIVE
Trichomonas: NEGATIVE

## 2021-10-26 ENCOUNTER — Ambulatory Visit (HOSPITAL_COMMUNITY)
Admission: EM | Admit: 2021-10-26 | Discharge: 2021-10-26 | Disposition: A | Discharge status: Home / Self Care | Payer: Medicaid Other | Attending: Internal Medicine | Admitting: Internal Medicine

## 2021-10-26 ENCOUNTER — Other Ambulatory Visit: Payer: Self-pay

## 2021-10-26 ENCOUNTER — Encounter (HOSPITAL_COMMUNITY): Payer: Self-pay | Admitting: Emergency Medicine

## 2021-10-26 DIAGNOSIS — H66001 Acute suppurative otitis media without spontaneous rupture of ear drum, right ear: Secondary | ICD-10-CM | POA: Diagnosis present

## 2021-10-26 DIAGNOSIS — U071 COVID-19: Secondary | ICD-10-CM | POA: Diagnosis not present

## 2021-10-26 LAB — POC INFLUENZA A AND B ANTIGEN (URGENT CARE ONLY)
INFLUENZA A ANTIGEN, POC: NEGATIVE
INFLUENZA B ANTIGEN, POC: NEGATIVE

## 2021-10-26 MED ORDER — ACETAMINOPHEN 325 MG PO TABS
975.0000 mg | ORAL_TABLET | Freq: Once | ORAL | Status: AC
  Administered 2021-10-26: 975 mg via ORAL

## 2021-10-26 MED ORDER — DOXYCYCLINE HYCLATE 100 MG PO CAPS: 100.0000 mg | ORAL_CAPSULE | Freq: Two times a day (BID) | ORAL | 0 refills | Status: AC

## 2021-10-26 MED ORDER — ACETAMINOPHEN 325 MG PO TABS
ORAL_TABLET | ORAL | Status: AC
  Filled 2021-10-26: qty 3

## 2021-10-26 NOTE — Discharge Instructions (Signed)
For right ear infection, please begin doxycycline.  Please take 1 tablet twice daily for the next 10 days.  If you have not had significant relief of your symptoms after completing a full 10-day course, please return for repeat evaluation.  Thank you for visiting urgent care.  I hope you feel better soon.

## 2021-10-26 NOTE — ED Provider Notes (Signed)
[facility]    CSN: [id number] Arrival date & time: 10/26/21  1735    HISTORY   Chief Complaint  Patient presents with   URI   HPI Sarah Copeland is a 26 y.o. female. Patient complaints of chills, nasal congestion, sore throat, headache and right ear pain x1 day.  Patient presents with a temperature of 1-2.3 and a heart rate of 114.  Influenza test today is negative.  COVID-19 test is positive.  The history is provided by the patient.  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   Group B streptococcal bacteriuria 02/25/2021   History of 3 cesarean sections 12/31/2020   Past Surgical History:  Procedure Laterality Date   CESAREAN SECTION     CESAREAN SECTION N/A 07/19/2021   Procedure: CESAREAN SECTION;  Surgeon: Caren Macadam, MD;  Location: [facility];  Service: Obstetrics;  Laterality: N/A;   OB History     Gravida  3   Para  3   Term  3   Preterm      AB      Living  3      SAB      IAB      Ectopic      Multiple  0   Live Births  3        Obstetric Comments  1- Pre E 2- wasn't dilating        Home Medications    Prior to Admission medications   Medication Sig Start Date End Date Taking? Authorizing Provider  doxycycline (VIBRAMYCIN) 100 MG capsule Take 1 capsule (100 mg total) by mouth 2 (two) times daily for 10 days. 10/26/21 11/05/21 Yes Lynden Oxford Scales, PA-C  ferrous sulfate 325 (65 FE) MG tablet Take 1 tablet (325 mg total) by mouth every other day. 09/04/21   Gabriel Carina, CNM  fluconazole (DIFLUCAN) 150 MG tablet 1 tab po x 1, may repeat in 3 days; 10/21/21   Rondel Oh, MD  norgestimate-ethinyl estradiol (ORTHO-CYCLEN)  0.25-35 MG-MCG tablet Take 1 tablet by mouth daily. 09/04/21   Gabriel Carina, CNM   Family History Family History  Problem Relation Age of Onset   Depression Mother    Hypertension Mother    Bipolar disorder Mother    Schizophrenia Mother    Kidney disease Father        21- kidney failure   Social History Social History   Tobacco Use   Smoking status: Former    Types: Cigars    Quit date: 06/13/2021    Years since quitting: 0.3   Smokeless tobacco: Never   Tobacco comments:    black and mild  Vaping Use   Vaping Use: Never used  Substance Use Topics   Alcohol use: Never   Drug use: Never   Allergies   Amoxicillin  Review of Systems Review of Systems Pertinent findings noted in history of present illness.   Physical Exam Triage Vital Signs ED Triage Vitals  Enc Vitals Group     BP 07/19/21 0827 (!) 147/82     Pulse Rate 07/19/21 0827 72  Resp 07/19/21 0827 18     Temp 07/19/21 0827 98.3 F (36.8 C)     Temp Source 07/19/21 0827 Oral     SpO2 07/19/21 0827 98 %     Weight --      Height --      Head Circumference --      Peak Flow --      Pain Score 07/19/21 0826 5     Pain Loc --      Pain Edu? --      Excl. in [place]? --   No data found.  Updated Vital Signs BP 126/78 (BP Location: Left Arm)    Pulse (!) 114    Temp (!) 102.3 F (39.1 C) (Oral)    Resp 20    SpO2 100%   Physical Exam Vitals and nursing note reviewed.  Constitutional:      General: She is not in acute distress.    Appearance: Normal appearance. She is not ill-appearing.  HENT:     Head: Normocephalic and atraumatic.     Salivary Glands: Right salivary gland is not diffusely enlarged or tender. Left salivary gland is not diffusely enlarged or tender.     Right Ear: Hearing, ear canal and external ear normal. No drainage. No middle ear effusion. There is no impacted cerumen. Tympanic membrane is injected, erythematous and bulging (Suppurative).     Left Ear: Hearing, tympanic  membrane, ear canal and external ear normal. No drainage.  No middle ear effusion. There is no impacted cerumen. Tympanic membrane is not erythematous or bulging.     Nose: Nose normal. No nasal deformity, septal deviation, mucosal edema, congestion or rhinorrhea.     Right Turbinates: Not enlarged, swollen or pale.     Left Turbinates: Not enlarged, swollen or pale.     Right Sinus: No maxillary sinus tenderness or frontal sinus tenderness.     Left Sinus: No maxillary sinus tenderness or frontal sinus tenderness.     Mouth/Throat:     Lips: Pink. No lesions.     Mouth: Mucous membranes are moist. No oral lesions.     Pharynx: Oropharynx is clear. Uvula midline. No posterior oropharyngeal erythema or uvula swelling.     Tonsils: No tonsillar exudate. 0 on the right. 0 on the left.  Eyes:     General: Lids are normal.        Right eye: No discharge.        Left eye: No discharge.     Extraocular Movements: Extraocular movements intact.     Conjunctiva/sclera: Conjunctivae normal.     Right eye: Right conjunctiva is not injected.     Left eye: Left conjunctiva is not injected.  Neck:     Trachea: Trachea and phonation normal.  Cardiovascular:     Rate and Rhythm: Normal rate and regular rhythm.     Pulses: Normal pulses.     Heart sounds: Normal heart sounds. No murmur heard.   No friction rub. No gallop.  Pulmonary:     Effort: Pulmonary effort is normal. No accessory muscle usage, prolonged expiration or respiratory distress.     Breath sounds: Normal breath sounds. No stridor, decreased air movement or transmitted upper airway sounds. No decreased breath sounds, wheezing, rhonchi or rales.  Chest:     Chest wall: No tenderness.  Musculoskeletal:        General: Normal range of motion.     Cervical back: Normal range of motion and neck  supple. Normal range of motion.  Lymphadenopathy:     Cervical: No cervical adenopathy.  Skin:    General: Skin is warm and dry.     Findings:  No erythema or rash.  Neurological:     General: No focal deficit present.     Mental Status: She is alert and oriented to person, place, and time.  Psychiatric:        Mood and Affect: Mood normal.        Behavior: Behavior normal.    Visual Acuity Right Eye Distance:   Left Eye Distance:   Bilateral Distance:    Right Eye Near:   Left Eye Near:    Bilateral Near:     UC Couse / Diagnostics / Procedures:    EKG  Radiology No results found.  Procedures Procedures (including critical care time)  UC Diagnoses / Final Clinical Impressions(s)   I have reviewed the triage vital signs and the nursing notes.  Pertinent labs & imaging results that were available during my care of the patient were reviewed by me and considered in my medical decision making (see chart for details).   Final diagnoses:  Acute suppurative otitis media of right ear  COVID-19   Patient provided with doxycycline for acute right suppurative otitis media.  At time of writing this note, patient has had a positive COVID-19 test.  Paxlovid will be prescribed for her.  Conservative care recommended, supportive medications prescribed, return precautions advised.  ED Prescriptions     Medication Sig Dispense Auth. Provider   doxycycline (VIBRAMYCIN) 100 MG capsule Take 1 capsule (100 mg total) by mouth 2 (two) times daily for 10 days. 20 capsule Lynden Oxford Scales, PA-C      PDMP not reviewed this encounter.  Pending results:  Labs Reviewed  SARS CORONAVIRUS 2 (TAT 6-24 HRS) - Abnormal; Notable for the following components:      Result Value   SARS Coronavirus 2 POSITIVE (*)    All other components within normal limits  POC INFLUENZA A AND B ANTIGEN (URGENT CARE ONLY)    Medications Ordered in UC: Medications  acetaminophen (TYLENOL) tablet 975 mg (975 mg Oral Given 10/26/21 1829)    Disposition Upon Discharge:  Condition: stable for discharge home Home: take medications as prescribed;  routine discharge instructions as discussed; follow up as advised.  Patient presented with an acute illness with associated systemic symptoms and significant discomfort requiring urgent management. In my opinion, this is a condition that a prudent lay person (someone who possesses an average knowledge of health and medicine) may potentially expect to result in complications if not addressed urgently such as respiratory distress, impairment of bodily function or dysfunction of bodily organs.   Routine symptom specific, illness specific and/or disease specific instructions were discussed with the patient and/or caregiver at length.   As such, the patient has been evaluated and assessed, work-up was performed and treatment was provided in alignment with urgent care protocols and evidence based medicine.  Patient/parent/caregiver has been advised that the patient may require follow up for further testing and treatment if the symptoms continue in spite of treatment, as clinically indicated and appropriate.  If the patient was tested for COVID-19, Influenza and/or RSV, then the patient/parent/guardian was advised to isolate at home pending the results of his/her diagnostic coronavirus test and potentially longer if theyre positive. I have also advised pt that if his/her COVID-19 test returns positive, it's recommended to self-isolate for at least 10 days after  symptoms first appeared AND until fever-free for 24 hours without fever reducer AND other symptoms have improved or resolved. Discussed self-isolation recommendations as well as instructions for household member/close contacts as per the [facility] and [place] DHHS, and also gave patient the [place] packet with this information.  Patient/parent/caregiver has been advised to return to the [facility] or PCP in 3-5 days if no better; to PCP or the Emergency Department if new signs and symptoms develop, or if the current signs or symptoms continue to change or worsen for further  workup, evaluation and treatment as clinically indicated and appropriate  The patient will follow up with their current PCP if and as advised. If the patient does not currently have a PCP we will assist them in obtaining one.   The patient may need specialty follow up if the symptoms continue, in spite of conservative treatment and management, for further workup, evaluation, consultation and treatment as clinically indicated and appropriate.  Patient/parent/caregiver verbalized understanding and agreement of plan as discussed.  All questions were addressed during visit.  Please see discharge instructions below for further details of plan.  Discharge Instructions:   Discharge Instructions      For right ear infection, please begin doxycycline.  Please take 1 tablet twice daily for the next 10 days.  If you have not had significant relief of your symptoms after completing a full 10-day course, please return for repeat evaluation.  Thank you for visiting urgent care.  I hope you feel better soon.    This office note has been dictated using Museum/gallery curator.  Unfortunately, and despite my best efforts, this method of dictation can sometimes lead to occasional typographical or grammatical errors.  I apologize in advance if this occurs.     Lynden Oxford Scales, [place] 10/28/21 [phone]

## 2021-10-26 NOTE — ED Triage Notes (Signed)
Pt reports chills, nasal congestion, sore throat,headache and right ear pain x 1 day.

## 2021-10-27 LAB — SARS CORONAVIRUS 2 (TAT 6-24 HRS): SARS Coronavirus 2: POSITIVE — AB

## 2021-10-28 ENCOUNTER — Telehealth: Payer: Self-pay

## 2021-10-28 ENCOUNTER — Telehealth: Payer: Self-pay | Admitting: Emergency Medicine

## 2021-10-28 MED ORDER — NIRMATRELVIR/RITONAVIR (PAXLOVID)TABLET: 3.0000 | ORAL_TABLET | Freq: Two times a day (BID) | ORAL | 0 refills | Status: AC

## 2021-10-28 NOTE — Telephone Encounter (Signed)
Positive COVID-19, Paxlovid prescribed.

## 2021-10-28 NOTE — Telephone Encounter (Signed)
Patient called by nurse (name and birthday were verified) and notified of Covid results as well as new medication added (Paxlovid). All questions answered.

## 2021-12-07 ENCOUNTER — Inpatient Hospital Stay (HOSPITAL_COMMUNITY)
Admission: AD | Admit: 2021-12-07 | Discharge: 2021-12-07 | Disposition: A | Discharge status: Home / Self Care | Payer: Medicaid Other | Attending: Obstetrics and Gynecology | Admitting: Obstetrics and Gynecology

## 2021-12-07 ENCOUNTER — Inpatient Hospital Stay (HOSPITAL_COMMUNITY): Payer: Medicaid Other

## 2021-12-07 ENCOUNTER — Encounter (HOSPITAL_COMMUNITY): Payer: Self-pay | Admitting: Obstetrics and Gynecology

## 2021-12-07 ENCOUNTER — Other Ambulatory Visit: Payer: Self-pay

## 2021-12-07 DIAGNOSIS — O208 Other hemorrhage in early pregnancy: Secondary | ICD-10-CM | POA: Insufficient documentation

## 2021-12-07 DIAGNOSIS — Z3A08 8 weeks gestation of pregnancy: Secondary | ICD-10-CM | POA: Diagnosis not present

## 2021-12-07 DIAGNOSIS — B3731 Acute candidiasis of vulva and vagina: Secondary | ICD-10-CM

## 2021-12-07 DIAGNOSIS — O418X10 Other specified disorders of amniotic fluid and membranes, first trimester, not applicable or unspecified: Secondary | ICD-10-CM

## 2021-12-07 DIAGNOSIS — O09292 Supervision of pregnancy with other poor reproductive or obstetric history, second trimester: Secondary | ICD-10-CM | POA: Insufficient documentation

## 2021-12-07 LAB — CBC
HCT: 33.2 % — ABNORMAL LOW (ref 36.0–46.0)
Hemoglobin: 10.8 g/dL — ABNORMAL LOW (ref 12.0–15.0)
MCH: 21.6 pg — ABNORMAL LOW (ref 26.0–34.0)
MCHC: 32.5 g/dL (ref 30.0–36.0)
MCV: 66.4 fL — ABNORMAL LOW (ref 80.0–100.0)
Platelets: 295 10*3/uL (ref 150–400)
RBC: 5 MIL/uL (ref 3.87–5.11)
RDW: 15.9 % — ABNORMAL HIGH (ref 11.5–15.5)
WBC: 9.1 10*3/uL (ref 4.0–10.5)
nRBC: 0 % (ref 0.0–0.2)

## 2021-12-07 LAB — URINALYSIS, ROUTINE W REFLEX MICROSCOPIC
Bacteria, UA: NONE SEEN
Bilirubin Urine: NEGATIVE
Glucose, UA: NEGATIVE mg/dL
Hgb urine dipstick: NEGATIVE
Ketones, ur: 5 mg/dL — AB
Nitrite: NEGATIVE
Protein, ur: NEGATIVE mg/dL
Specific Gravity, Urine: 1.032 — ABNORMAL HIGH (ref 1.005–1.030)
pH: 5 (ref 5.0–8.0)

## 2021-12-07 LAB — WET PREP, GENITAL
Clue Cells Wet Prep HPF POC: NONE SEEN
Sperm: NONE SEEN
Trich, Wet Prep: NONE SEEN
WBC, Wet Prep HPF POC: >=10 — AB (ref <10)

## 2021-12-07 LAB — HCG, QUANTITATIVE, PREGNANCY: hCG, Beta Chain, Quant, S: 166629 m[IU]/mL — ABNORMAL HIGH (ref <5)

## 2021-12-07 MED ORDER — TERCONAZOLE 0.4 % VA CREA: 1.0000 | TOPICAL_CREAM | Freq: Every day | VAGINAL | 0 refills | Status: DC

## 2021-12-07 NOTE — MAU Note (Signed)
Sarah Copeland is a 26 y.o. at [redacted]w[redacted]d here in MAU reporting: when she is pregnant she gets yeast infections frequently. Started having increased discharge on Monday. States it is yellow. No itching. No odor. ?LMP: 10/08/2021 ? ?Onset of complaint: ongoing ? ?Pain score: 0/10 ? ?Vitals:  ? 12/07/21 1723  ?BP: 128/72  ?Pulse: 85  ?Resp: 16  ?Temp: 98.9 ?F (37.2 ?C)  ?SpO2: 99%  ?   ?Lab orders placed from triage: none ? ?

## 2021-12-07 NOTE — MAU Note (Signed)
Pt collected blind swabs in the bathroom and noted a small amount of blood. No blood on the cotton part of the swab but some light bleeding noted on plastic. Sarah Copeland CNM informed and will enter orders. ?

## 2021-12-07 NOTE — MAU Provider Note (Signed)
?History  ?  ? ?CSN: [id number] ? ?Arrival date and time: 12/07/21 1706 ? ? None  ?  ? ?Chief Complaint  ?Patient presents with  ? Vaginal Discharge  ? ?HPI ?Sarah Copeland is a 26 y.o. [phone] at [redacted]w[redacted]d by LMP who presents to MAU for possible yeast infection. Patient reports yellow, watery discharge that is irritating since Monday, however worsened on Wednesday. On arrival to MAU while she was self-swabbing, she started having some vaginal bleeding/spotting. She reports intermittent mild cramping lower abdomen. LMP was 10/08/2021.  ? ?Patient plans to start prenatal care at [facility] and has appointment scheduled on April 6th. ? ?OB History   ? ? Gravida  ?4  ? Para  ?3  ? Term  ?3  ? Preterm  ?   ? AB  ?   ? Living  ?3  ?  ? ? SAB  ?   ? IAB  ?   ? Ectopic  ?   ? Multiple  ?0  ? Live Births  ?3  ?   ?  ? Obstetric Comments  ?1- Pre E ?2- wasn't dilating  ?  ? ?  ? ? ?Past Medical History:  ?Diagnosis Date  ? Anemia   ? Anxiety   ? Depression   ? stressed, going through custody battle  ? Headache   ? Kidney stones   ? Medical history non-contributory   ? Preeclampsia 2018  ? Vaginal Pap smear, abnormal   ? f/u PP  ? ? ?Past Surgical History:  ?Procedure Laterality Date  ? CESAREAN SECTION    ? CESAREAN SECTION N/A 07/19/2021  ? Procedure: CESAREAN SECTION;  Surgeon: Federico Flake, MD;  Location: [facility] LD ORS;  Service: Obstetrics;  Laterality: N/A;  ? ? ?Family History  ?Problem Relation Age of Onset  ? Depression Mother   ? Hypertension Mother   ? Bipolar disorder Mother   ? Schizophrenia Mother   ? Kidney disease Father   ?     38- kidney failure  ? ? ?Social History  ? ?Tobacco Use  ? Smoking status: Former  ?  Types: Cigars  ?  Quit date: 06/13/2021  ?  Years since quitting: 0.4  ? Smokeless tobacco: Never  ? Tobacco comments:  ?  black and mild  ?Vaping Use  ? Vaping Use: Never used  ?Substance Use Topics  ? Alcohol use: Never  ? Drug use: Never  ? ? ?Allergies:  ?Allergies  ?Allergen Reactions  ?  Amoxicillin Anaphylaxis  ? ? ?No medications prior to admission.  ? ? ?Review of Systems  ?Constitutional: Negative.   ?Respiratory: Negative.    ?Cardiovascular: Negative.   ?Gastrointestinal:  Positive for abdominal pain (cramping).  ?Genitourinary:  Positive for vaginal bleeding (spotting) and vaginal discharge. Negative for dysuria.  ?Musculoskeletal: Negative.   ?Neurological: Negative.   ?Physical Exam  ? ?Blood pressure 128/72, pulse 85, temperature 98.9 ?F (37.2 ?C), temperature source Oral, resp. rate 16, height 5\' 7"  (1.702 m), weight 100 kg, last menstrual period 10/08/2021, SpO2 99 %, not currently breastfeeding. ? ?Physical Exam ?Vitals and nursing note reviewed.  ?Constitutional:   ?   General: She is not in acute distress. ?Eyes:  ?   Extraocular Movements: Extraocular movements intact.  ?   Pupils: Pupils are equal, round, and reactive to light.  ?Cardiovascular:  ?   Rate and Rhythm: Normal rate.  ?Pulmonary:  ?   Effort: Pulmonary effort is normal.  ?Abdominal:  ?  Palpations: Abdomen is soft.  ?   Tenderness: There is no abdominal tenderness.  ?Genitourinary: ?   Comments: Patient self swabbed ? ?Musculoskeletal:     ?   General: Normal range of motion.  ?   Cervical back: Normal range of motion.  ?Skin: ?   General: Skin is warm and dry.  ?Neurological:  ?   General: No focal deficit present.  ?   Mental Status: She is alert and oriented to person, place, and time.  ?Psychiatric:     ?   Mood and Affect: Mood normal.     ?   Behavior: Behavior normal.     ?   Thought Content: Thought content normal.     ?   Judgment: Judgment normal.  ? ?US OB Comp Less 14 Wks ? ?Result Date: 12/07/2021 ?CLINICAL DATA:  26 year old pregnant female with vaginal bleeding. EXAM: OBSTETRIC <14 WK [place] AND TRANSVAGINAL OB US TECHNIQUE: Both transabdominal and transvaginal ultrasound examinations were performed for complete evaluation of the gestation as well as the maternal uterus, adnexal regions, and pelvic  cul-de-sac. Transvaginal technique was performed to assess early pregnancy. COMPARISON:  None. FINDINGS: Intrauterine gestational sac: Single Yolk sac:  Visualized. Embryo:  Visualized. Cardiac Activity: Visualized. Heart Rate: 168 bpm CRL:  17.8 mm   8 w   1 d                  [place] EDC: 07/18/2022 Subchorionic hemorrhage: A small to moderate subchorionic hemorrhage is noted. Maternal uterus/adnexae: The ovaries bilaterally are unremarkable. No free fluid or adnexal mass. IMPRESSION: 1. Single living intrauterine gestation with estimated gestational age of [redacted] weeks 1 day by this exam. 2. Small to moderate subchorionic hemorrhage. Electronically Signed   By: Harmon Pier M.D.   On: 12/07/2021 18:43   ? ?MAU Course  ?Procedures ?UA ?CBC, HCG ?Wet prep, GC/CT ?[place] ? ?MDM ?UA negative. Wet prep positive for yeast. GC/CT pending. Ultrasound with results as above. Live IUP c/w LMP. Subchorionic hemorrhage noted.  ? ?Assessment and Plan  ?[redacted] weeks gestation of pregnancy ?Yeast vaginitis ?Subchorionic hematoma in the first trimester ? ?- Discharge home in stable condition ?- Rx for Terazol sent to pharmacy ?- Pelvic rest ?- Strict return precautions ?- Return to MAU sooner or as needed for worsening symptoms ?- Keep OB appointment at [facility] as scheduled ? ? ? ?Brand Males, CNM ?12/07/2021, 7:05 PM  ?

## 2021-12-09 LAB — GC/CHLAMYDIA PROBE AMP (~~LOC~~) NOT AT ARMC
Chlamydia: NEGATIVE
Comment: NEGATIVE
Comment: NORMAL
Neisseria Gonorrhea: NEGATIVE

## 2021-12-26 LAB — OB RESULTS CONSOLE RPR: RPR: NONREACTIVE

## 2021-12-26 LAB — OB RESULTS CONSOLE GC/CHLAMYDIA
Chlamydia: NEGATIVE
Neisseria Gonorrhea: NEGATIVE

## 2021-12-26 LAB — OB RESULTS CONSOLE HIV ANTIBODY (ROUTINE TESTING): HIV: NONREACTIVE

## 2021-12-26 LAB — OB RESULTS CONSOLE RUBELLA ANTIBODY, IGM: Rubella: IMMUNE

## 2021-12-26 LAB — HEPATITIS C ANTIBODY: HCV Ab: NEGATIVE

## 2021-12-26 LAB — OB RESULTS CONSOLE HEPATITIS B SURFACE ANTIGEN: Hepatitis B Surface Ag: NEGATIVE

## 2022-01-01 DIAGNOSIS — Z148 Genetic carrier of other disease: Secondary | ICD-10-CM | POA: Insufficient documentation

## 2022-01-06 ENCOUNTER — Telehealth: Payer: Self-pay | Admitting: Hematology and Oncology

## 2022-01-06 NOTE — Telephone Encounter (Signed)
Scheduled appt per 4/12 referral. Pt is aware of appt date and time. Pt is aware to arrive 15 mins prior to appt time and to bring and updated insurance card. Pt is aware of appt location.   ?

## 2022-01-09 ENCOUNTER — Inpatient Hospital Stay (HOSPITAL_COMMUNITY)
Admission: AD | Admit: 2022-01-09 | Discharge: 2022-01-09 | Disposition: A | Discharge status: Home / Self Care | Payer: Medicaid Other | Attending: Obstetrics and Gynecology | Admitting: Obstetrics and Gynecology

## 2022-01-09 ENCOUNTER — Other Ambulatory Visit: Payer: Self-pay

## 2022-01-09 DIAGNOSIS — O34219 Maternal care for unspecified type scar from previous cesarean delivery: Secondary | ICD-10-CM | POA: Insufficient documentation

## 2022-01-09 DIAGNOSIS — Z3A13 13 weeks gestation of pregnancy: Secondary | ICD-10-CM | POA: Insufficient documentation

## 2022-01-09 DIAGNOSIS — B9689 Other specified bacterial agents as the cause of diseases classified elsewhere: Secondary | ICD-10-CM | POA: Diagnosis not present

## 2022-01-09 DIAGNOSIS — N898 Other specified noninflammatory disorders of vagina: Secondary | ICD-10-CM | POA: Diagnosis not present

## 2022-01-09 DIAGNOSIS — Z87891 Personal history of nicotine dependence: Secondary | ICD-10-CM | POA: Diagnosis not present

## 2022-01-09 DIAGNOSIS — Z880 Allergy status to penicillin: Secondary | ICD-10-CM | POA: Diagnosis not present

## 2022-01-09 DIAGNOSIS — O23591 Infection of other part of genital tract in pregnancy, first trimester: Secondary | ICD-10-CM | POA: Insufficient documentation

## 2022-01-09 DIAGNOSIS — N760 Acute vaginitis: Secondary | ICD-10-CM | POA: Diagnosis not present

## 2022-01-09 LAB — URINALYSIS, ROUTINE W REFLEX MICROSCOPIC
Bilirubin Urine: NEGATIVE
Glucose, UA: NEGATIVE mg/dL
Hgb urine dipstick: NEGATIVE
Ketones, ur: NEGATIVE mg/dL
Leukocytes,Ua: NEGATIVE
Nitrite: NEGATIVE
Protein, ur: NEGATIVE mg/dL
Specific Gravity, Urine: 1.023 (ref 1.005–1.030)
pH: 5 (ref 5.0–8.0)

## 2022-01-09 LAB — WET PREP, GENITAL
Sperm: NONE SEEN
Trich, Wet Prep: NONE SEEN
WBC, Wet Prep HPF POC: <10 (ref <10)
Yeast Wet Prep HPF POC: NONE SEEN

## 2022-01-09 MED ORDER — METRONIDAZOLE 500 MG PO TABS: 500.0000 mg | ORAL_TABLET | Freq: Two times a day (BID) | ORAL | 0 refills | Status: AC

## 2022-01-09 NOTE — MAU Provider Note (Signed)
Chief Complaint: Vaginitis ? ? Event Date/Time  ? First Provider Initiated Contact with Patient 01/09/22 0158   ?  ?   ?SUBJECTIVE ?HPI: Sarah Copeland is a 26 y.o. [phone] at [redacted]w[redacted]d by LMP who presents to maternity admissions reporting vaginal irritation and odiferous vaginal discharge.  Recently treated for vaginal yeast, feels like it has returned. Has not discussed with her doctor.  Marland KitchenHas been vigorously washing her perineum and vagina with Vagisil and other soaps.  ?She denies vaginal bleeding, urinary symptoms, h/a, dizziness, n/v, or fever/chills.   ? ?Vaginal Discharge ?The patient's primary symptoms include a genital odor and vaginal discharge. The patient's pertinent negatives include no genital itching, genital lesions, pelvic pain or vaginal bleeding. The current episode started in the past 7 days. The problem occurs constantly. The problem has been unchanged. The patient is experiencing no pain. Pertinent negatives include no abdominal pain, back pain, fever or frequency. The vaginal discharge was milky. There has been no bleeding. She has not been passing clots. She has not been passing tissue. Nothing aggravates the symptoms. Treatments tried: Terazol, soaps, cleaning. The treatment provided no relief. She is not sexually active.  ?RN Note: ?Sarah Copeland is a 26 y.o. at [redacted]w[redacted]d here in MAU reporting: an ongoing issue with "my vagina" "I just took a shower and my vagina smells like I need to take a shower." States some itching internally. Reports inserting left over cream from last yeast infection vaginally on Sunday which has helped with the itching. "creamy yellow discharge". Denies any recent intercourse so not concerned for STD, only concern for another yeast infection. Last yeast infection was in January. Tried monistat last week but "it didn't work". Denies any pain. ? ?Past Medical History:  ?Diagnosis Date  ? Anemia   ? Anxiety   ? Depression   ? stressed, going through custody battle  ?  Headache   ? Kidney stones   ? Medical history non-contributory   ? Preeclampsia 2018  ? Vaginal Pap smear, abnormal   ? f/u PP  ? ?Past Surgical History:  ?Procedure Laterality Date  ? CESAREAN SECTION    ? CESAREAN SECTION N/A 07/19/2021  ? Procedure: CESAREAN SECTION;  Surgeon: Caren Macadam, MD;  Location: [facility];  Service: Obstetrics;  Laterality: N/A;  ? ?Social History  ? ?Socioeconomic History  ? Marital status: Single  ?  Spouse name: Not on file  ? Number of children: Not on file  ? Years of education: Not on file  ? Highest education level: Not on file  ?Occupational History  ? Not on file  ?Tobacco Use  ? Smoking status: Former  ?  Types: Cigars  ?  Quit date: 06/13/2021  ?  Years since quitting: 0.5  ? Smokeless tobacco: Never  ? Tobacco comments:  ?  black and mild  ?Vaping Use  ? Vaping Use: Never used  ?Substance and Sexual Activity  ? Alcohol use: Never  ? Drug use: Never  ? Sexual activity: Yes  ?  Birth control/protection: None  ?Other Topics Concern  ? Not on file  ?Social History Narrative  ? Not on file  ? ?Social Determinants of Health  ? ?Financial Resource Strain: Not on file  ?Food Insecurity: Food Insecurity Present  ? Worried About Charity fundraiser in the Last Year: Sometimes true  ? Ran Out of Food in the Last Year: Sometimes true  ?Transportation Needs: No Transportation Needs  ? Lack of Transportation (Medical): No  ?  Lack of Transportation (Non-Medical): No  ?Physical Activity: Not on file  ?Stress: Not on file  ?Social Connections: Not on file  ?Intimate Partner Violence: Not on file  ? ?No current facility-administered medications on file prior to encounter.  ? ?Current Outpatient Medications on File Prior to Encounter  ?Medication Sig Dispense Refill  ? Prenatal Vit-Fe Fumarate-FA (PRENATAL MULTIVITAMIN) TABS tablet Take 1 tablet by mouth daily at 12 noon.    ? terconazole (TERAZOL 7) 0.4 % vaginal cream Place 1 applicator vaginally at bedtime. Use for seven days 45 g  0  ? ?Allergies  ?Allergen Reactions  ? Amoxicillin Anaphylaxis  ? ? ?I have reviewed patient's Past Medical Hx, Surgical Hx, Family Hx, Social Hx, medications and allergies.  ? ?ROS:  ?Review of Systems  ?Constitutional:  Negative for fever.  ?Gastrointestinal:  Negative for abdominal pain.  ?Genitourinary:  Positive for vaginal discharge. Negative for frequency and pelvic pain.  ?Musculoskeletal:  Negative for back pain.  ?Review of Systems  ?Other systems negative ? ? ?Physical Exam  ?Physical Exam ?Patient Vitals for the past 24 hrs: ? BP Temp Temp src Pulse Resp SpO2 Height Weight  ?01/09/22 0100 134/73 -- -- 90 -- 100 % -- --  ?01/09/22 0033 130/78 99 ?F (37.2 ?C) Oral 78 20 100 % 5\' 7"  (1.702 m) 99.9 kg  ? ?Constitutional: Well-developed, well-nourished female in no acute distress.  ?Cardiovascular: normal rate ?Respiratory: normal effort ?GI: Abd soft, non-tender. Pos BS x 4 ?MS: Extremities nontender, no edema, normal ROM ?Neurologic: Alert and oriented x 4.  ?GU: Neg CVAT. ? ?PELVIC EXAM: mild vulvar edema with light skin changes.  No lesions.   ? ?FHT 161 by doppler ? ?LAB RESULTS ?Results for orders placed or performed during the hospital encounter of 01/09/22 (from the past 24 hour(s))  ?Urinalysis, Routine w reflex microscopic Urine, Clean Catch     Status: None  ? Collection Time: 01/09/22 12:47 AM  ?Result Value Ref Range  ? Color, Urine YELLOW YELLOW  ? APPearance CLEAR CLEAR  ? Specific Gravity, Urine 1.023 1.005 - 1.030  ? pH 5.0 5.0 - 8.0  ? Glucose, UA NEGATIVE NEGATIVE mg/dL  ? Hgb urine dipstick NEGATIVE NEGATIVE  ? Bilirubin Urine NEGATIVE NEGATIVE  ? Ketones, ur NEGATIVE NEGATIVE mg/dL  ? Protein, ur NEGATIVE NEGATIVE mg/dL  ? Nitrite NEGATIVE NEGATIVE  ? Leukocytes,Ua NEGATIVE NEGATIVE  ?Wet prep, genital     Status: Abnormal  ? Collection Time: 01/09/22  1:27 AM  ?Result Value Ref Range  ? Yeast Wet Prep HPF POC NONE SEEN NONE SEEN  ? Trich, Wet Prep NONE SEEN NONE SEEN  ? Clue Cells Wet  Prep HPF POC PRESENT (A) NONE SEEN  ? WBC, Wet Prep HPF POC <10 <10  ? Sperm NONE SEEN   ? ? ?--/--/O POS ([birth year]26 [id number]) ? ?IMAGING ?No results found. ? ?MAU Management/MDM: ?Ordered Wet prep.   ?Pelvic exam and cultures done ? ?Long discussion of hygeine, gentle cleansing with Basis or Aveeno oatmeal soap, keep dry, avoid over-scrubbing ?Will rx Flagyl for BV ? ?ASSESSMENT ?Single IUP at.[redacted]w[redacted]d ?Vaginal irritation ?Bacterial Vaginosis ? ?PLAN ?Discharge home ?Rx Flagyl for BV ?Perineal hygeine ? ?Pt stable at time of discharge. ?Encouraged to return here if she develops worsening of symptoms, increase in pain, fever, or other concerning symptoms.  ? ? ?Hansel Feinstein CNM, MSN ?Certified Nurse-Midwife ?01/09/2022  ?2:16 AM ? ? ? ? ?

## 2022-01-09 NOTE — MAU Note (Signed)
Sarah Copeland is a 26 y.o. at [redacted]w[redacted]d here in MAU reporting: an ongoing issue with "my vagina" "I just took a shower and my vagina smells like I need to take a shower." States some itching internally. Reports inserting left over cream from last yeast infection vaginally on Sunday which has helped with the itching. "creamy yellow discharge". Denies any recent intercourse so not concerned for STD, only concern for another yeast infection. Last yeast infection was in January. Tried monistat last week but "it didn't work". Denies any pain.  ? ?Onset of complaint: 2 weeks ago ?Pain score: no ?Vitals:  ? 01/09/22 0033  ?BP: 130/78  ?Pulse: 78  ?Resp: 20  ?Temp: 99 ?F (37.2 ?C)  ?SpO2: 100%  ?   ?FHT:161 ?Lab orders placed from triage: u/a  ? ?

## 2022-01-20 ENCOUNTER — Encounter (HOSPITAL_COMMUNITY): Payer: Self-pay | Admitting: Obstetrics

## 2022-01-20 ENCOUNTER — Inpatient Hospital Stay (HOSPITAL_COMMUNITY)
Admission: AD | Admit: 2022-01-20 | Discharge: 2022-01-20 | Disposition: A | Discharge status: Home / Self Care | Payer: Medicaid Other | Attending: Obstetrics | Admitting: Obstetrics

## 2022-01-20 DIAGNOSIS — O98812 Other maternal infectious and parasitic diseases complicating pregnancy, second trimester: Secondary | ICD-10-CM | POA: Insufficient documentation

## 2022-01-20 DIAGNOSIS — O09292 Supervision of pregnancy with other poor reproductive or obstetric history, second trimester: Secondary | ICD-10-CM | POA: Insufficient documentation

## 2022-01-20 DIAGNOSIS — O23592 Infection of other part of genital tract in pregnancy, second trimester: Secondary | ICD-10-CM | POA: Diagnosis not present

## 2022-01-20 DIAGNOSIS — B3731 Acute candidiasis of vulva and vagina: Secondary | ICD-10-CM

## 2022-01-20 DIAGNOSIS — O26892 Other specified pregnancy related conditions, second trimester: Secondary | ICD-10-CM | POA: Insufficient documentation

## 2022-01-20 DIAGNOSIS — Z3A14 14 weeks gestation of pregnancy: Secondary | ICD-10-CM | POA: Diagnosis not present

## 2022-01-20 LAB — URINALYSIS, ROUTINE W REFLEX MICROSCOPIC
Bacteria, UA: NONE SEEN
Bilirubin Urine: NEGATIVE
Glucose, UA: NEGATIVE mg/dL
Hgb urine dipstick: NEGATIVE
Ketones, ur: NEGATIVE mg/dL
Nitrite: NEGATIVE
Protein, ur: NEGATIVE mg/dL
Specific Gravity, Urine: 1.02 (ref 1.005–1.030)
pH: 6 (ref 5.0–8.0)

## 2022-01-20 LAB — WET PREP, GENITAL
Clue Cells Wet Prep HPF POC: NONE SEEN
Sperm: NONE SEEN
Trich, Wet Prep: NONE SEEN
WBC, Wet Prep HPF POC: >=10 — AB (ref <10)

## 2022-01-20 MED ORDER — TERCONAZOLE 0.4 % VA CREA: 1.0000 | TOPICAL_CREAM | Freq: Every day | VAGINAL | 1 refills | Status: DC

## 2022-01-20 NOTE — MAU Note (Signed)
Pt says she was here last mth for same symptoms. Gave meds - didn't finish  ?Burns when voids- thinks scratched perineum. ?She thinks meds make her hear things.  ?Now s/s- scratching, yellowish water D/C .  ?[facility]- 02-11-2022-  appointment with Dr Mindi Slicker ?

## 2022-01-20 NOTE — MAU Provider Note (Signed)
?History  ?  ? ?[id number] ? ?Arrival date and time: 01/20/22 1910 ?  ? ?Chief Complaint  ?Patient presents with  ? Vaginal Itching  ? ? ? ?HPI ?Sarah Copeland is a 26 y.o. at [redacted]w[redacted]d by LMP who presents for vaginal discharge & irritation. ?Symptoms started yesterday. Reports external irritation & itching. Has had an increase in watery discharge that appears green/yellow. Was treated for BV last week but didn't complete antibiotics due to side effects - reports it caused bad headaches whenever she took them. Denies abdominal pain, dysuria, or vaginal bleeding. No recent intercourse.   ? ?OB History   ? ? Gravida  ?4  ? Para  ?3  ? Term  ?3  ? Preterm  ?   ? AB  ?   ? Living  ?3  ?  ? ? SAB  ?   ? IAB  ?   ? Ectopic  ?   ? Multiple  ?0  ? Live Births  ?3  ?   ?  ? Obstetric Comments  ?1- Pre E ?2- wasn't dilating  ?  ? ?  ? ? ?Past Medical History:  ?Diagnosis Date  ? Anemia   ? Anxiety   ? Depression   ? stressed, going through custody battle  ? Headache   ? Kidney stones   ? Preeclampsia 2018  ? Vaginal Pap smear, abnormal   ? f/u PP  ? ? ?Past Surgical History:  ?Procedure Laterality Date  ? CESAREAN SECTION    ? CESAREAN SECTION N/A 07/19/2021  ? Procedure: CESAREAN SECTION;  Surgeon: Federico Flake, MD;  Location: [facility] LD [facility]S;  Service: Obstetrics;  Laterality: N/A;  ? ? ?Family History  ?Problem Relation Age of Onset  ? Depression Mother   ? Hypertension Mother   ? Bipolar disorder Mother   ? Schizophrenia Mother   ? Kidney disease Father   ?     20- kidney failure  ? ? ?Allergies  ?Allergen Reactions  ? Amoxicillin Anaphylaxis  ? ? ?No current facility-administered medications on file prior to encounter.  ? ?Current Outpatient Medications on File Prior to Encounter  ?Medication Sig Dispense Refill  ? Prenatal Vit-Fe Fumarate-FA (PRENATAL MULTIVITAMIN) TABS tablet Take 1 tablet by mouth daily at 12 noon.    ? terconazole (TERAZOL 7) 0.4 % vaginal cream Place 1 applicator vaginally at bedtime. Use for seven  days 45 g 0  ? ? ? ?ROS ?Pertinent positives and negative per HPI, all others reviewed and negative ? ?Physical Exam  ? ?BP 118/72 (BP Location: Right Arm)   Pulse 91   Temp 98.8 ?F (37.1 ?C) (Oral)   Resp 20   Ht (!) 7" (0.178 m)   Wt 100.2 kg   LMP 10/08/2021   BMI 3171.02 kg/m?  ? ?Patient Vitals for the past 24 hrs: ? BP Temp Temp src Pulse Resp Height Weight  ?01/20/22 1923 118/72 98.8 ?F (37.1 ?C) Oral 91 20 (!) 7" (0.178 m) 100.2 kg  ? ? ?Physical Exam ?Vitals and nursing note reviewed. Exam conducted with a chaperone present.  ?Constitutional:   ?   General: She is not in acute distress. ?   Appearance: Normal appearance.  ?HENT:  ?   Head: Normocephalic and atraumatic.  ?Eyes:  ?   General: No scleral icterus. ?   Conjunctiva/sclera: Conjunctivae normal.  ?   Pupils: Pupils are equal, round, and reactive to light.  ?Genitourinary: ?   Vagina: Erythema present.  ?  Cervix: Discharge (curdy white green) present. No friability.  ?Skin: ?   General: Skin is warm and dry.  ?Neurological:  ?   Mental Status: She is alert.  ?  ? ? ? ?Labs ?Results for orders placed or performed during the hospital encounter of 01/20/22 (from the past 24 hour(s))  ?Wet prep, genital     Status: Abnormal  ? Collection Time: 01/20/22  8:25 PM  ? Specimen: PATH Cytology Cervicovaginal Ancillary Only  ?Result Value Ref Range  ? Yeast Wet Prep HPF POC PRESENT (A) NONE SEEN  ? Trich, Wet Prep NONE SEEN NONE SEEN  ? Clue Cells Wet Prep HPF POC NONE SEEN NONE SEEN  ? WBC, Wet Prep HPF POC >=10 (A) <10  ? Sperm NONE SEEN   ? ? ?Imaging ?No results found. ? ?MAU Course  ?Procedures ?Lab Orders    ?     Wet prep, genital    ?     Urinalysis, Routine w reflex microscopic Urine, Clean Catch    ? ?Meds ordered this encounter  ?Medications  ? terconazole (TERAZOL 7) 0.4 % vaginal cream  ?  Sig: Place 1 applicator vaginally at bedtime. Use for seven days  ?  Dispense:  45 g  ?  Refill:  1  ?  Order Specific Question:   Supervising Provider   ?  Answer:   CONSTANT, PEGGY [[id number]]  ? ?Imaging Orders  ?No imaging studies ordered today  ? ? ?MDM ?FHT present via doppler ?Didn't complete flagyl from previous BV diagnosis but wet prep tonight is negative for clue cells. Positive for yeast & appears to be yeast based on exam.  ?Assessment and Plan  ? ?1. Vaginal yeast infection   ?2. [redacted] weeks gestation of pregnancy   ? ?-Rx terazol ?-GC/CT pending ? ? ?Judeth Horn, NP ?01/20/22 ?8:45 PM ? ? ?

## 2022-01-21 LAB — GC/CHLAMYDIA PROBE AMP (~~LOC~~) NOT AT ARMC
Chlamydia: NEGATIVE
Comment: NEGATIVE
Comment: NORMAL
Neisseria Gonorrhea: NEGATIVE

## 2022-01-22 ENCOUNTER — Inpatient Hospital Stay: Payer: Medicaid Other

## 2022-01-22 ENCOUNTER — Inpatient Hospital Stay: Payer: Medicaid Other | Admitting: Hematology and Oncology

## 2022-01-23 ENCOUNTER — Telehealth: Payer: Self-pay | Admitting: Hematology and Oncology

## 2022-01-23 NOTE — Telephone Encounter (Signed)
Pt cancelled new hem appt. Called pt, no answer and vm was not set up.  ?

## 2022-06-10 NOTE — Patient Instructions (Signed)
Christy Ehrsam  06/10/2022   Your procedure is scheduled on:  06/24/2022  Arrive at 21 at Entrance C on [address] at [facility]  and [address]. You are invited to use the FREE valet parking or use the Visitor's parking deck.  Pick up the phone at the desk and dial [phone].  Call this number if you have problems the morning of surgery: [phone]  Remember:   Do not eat food:(6 Hours before arrival) 6 horas ante llegada.for ex toast without butter and a clear liquid drink no later than 0630 AM  Do not drink clear liquids: (4 Hours before arrival) 4 horas ante llegada.0830  Take these medicines the morning of surgery with A SIP OF WATER:  none   Do not wear jewelry, make-up or nail polish.  Do not wear lotions, powders, or perfumes. Do not wear deodorant.  Do not shave 48 hours prior to surgery.  Do not bring valuables to the hospital.  [facility] is not   responsible for any belongings or valuables brought to the hospital.  Contacts, dentures or bridgework may not be worn into surgery.  Leave suitcase in the car. After surgery it may be brought to your room.  For patients admitted to the hospital, checkout time is 11:00 AM the day of              discharge.      Please read over the following fact sheets that you were given:     Preparing for Surgery

## 2022-06-11 ENCOUNTER — Encounter (HOSPITAL_COMMUNITY): Payer: Self-pay

## 2022-06-23 ENCOUNTER — Encounter (HOSPITAL_COMMUNITY)
Admission: RE | Admit: 2022-06-23 | Discharge: 2022-06-23 | Disposition: A | Discharge status: Home / Self Care | Payer: Medicaid Other | Source: Ambulatory Visit | Attending: Obstetrics and Gynecology | Admitting: Obstetrics and Gynecology

## 2022-06-23 LAB — CBC
HCT: 29.1 % — ABNORMAL LOW (ref 36.0–46.0)
Hemoglobin: 9.6 g/dL — ABNORMAL LOW (ref 12.0–15.0)
MCH: 21.6 pg — ABNORMAL LOW (ref 26.0–34.0)
MCHC: 33 g/dL (ref 30.0–36.0)
MCV: 65.5 fL — ABNORMAL LOW (ref 80.0–100.0)
Platelets: 163 10*3/uL (ref 150–400)
RBC: 4.44 MIL/uL (ref 3.87–5.11)
RDW: 15.4 % (ref 11.5–15.5)
WBC: 6.8 10*3/uL (ref 4.0–10.5)
nRBC: 0 % (ref 0.0–0.2)

## 2022-06-23 LAB — TYPE AND SCREEN
ABO/RH(D): O POS
Antibody Screen: NEGATIVE

## 2022-06-23 NOTE — H&P (Signed)
Sarah Copeland is a 26 y.o. [phone] female presenting for scheduled repeat cesarean section and bilateral tubal ligation.  She is dated per LMP which was confirmed with an 11 week [place].  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 Short interval pregnancy   GBS neg  OB History     Gravida  4   Para  3   Term  3   Preterm      AB      Living  3      SAB      IAB      Ectopic      Multiple  0   Live Births  3        Obstetric Comments  1- Pre E 2- wasn't dilating        Past Medical History:  Diagnosis Date   Anemia    Anxiety    Depression    stressed, going through custody battle   Headache    Kidney stones    Preeclampsia 2018   Vaginal Pap smear, abnormal    f/u PP   Past Surgical History:  Procedure Laterality Date   CESAREAN SECTION     CESAREAN SECTION N/A 07/19/2021   Procedure: CESAREAN SECTION;  Surgeon: Federico Flake, MD;  Location: [facility] LD [facility]S;  Service: Obstetrics;  Laterality: N/A;   Family History: family history includes Bipolar disorder in her mother; Depression in her mother; Hypertension in her mother; Kidney disease in her father; Schizophrenia in her mother. Social History:  reports that she quit smoking about a year ago. Her smoking use included cigars. She has never used smokeless tobacco. She reports that she does not drink alcohol and does not use drugs.     Maternal Diabetes: No Genetic Screening: Normal Maternal Ultrasounds/Referrals: Other: SGA Fetal Ultrasounds or other Referrals:  None Maternal Substance Abuse:  No Significant Maternal Medications:  None Significant Maternal Lab Results:  Group B Strep negative Number of Prenatal  Visits:greater than 3 verified prenatal visits Other Comments:  None  Review of Systems  Constitutional:  Negative for activity change and fatigue.  Eyes:  Negative for photophobia and redness.  Respiratory:  Negative for chest tightness and shortness of breath.   Cardiovascular:  Negative for chest pain, palpitations and leg swelling.  Gastrointestinal:  Negative for abdominal pain.  Genitourinary:  Negative for pelvic pain and vaginal bleeding.  Musculoskeletal:  Negative for back pain.  Neurological:  Negative for headaches.  Psychiatric/Behavioral:  The patient is nervous/anxious.    Maternal Medical History:  Reason for admission: Scheduled repeat cesarean section and bilateral tubal ligation   Contractions: Frequency: rare.   Perceived severity is mild.   Fetal activity: Perceived fetal activity is normal.   Prenatal complications: IUGR.   Prenatal Complications - Diabetes: none.     Last menstrual period 10/08/2021, not currently breastfeeding. Maternal Exam:  Uterine Assessment: Contraction strength is mild.  Contraction frequency is rare.  Abdomen: Patient reports no abdominal tenderness. Estimated fetal weight is SGA.   Fetal presentation: vertex Introitus: Normal vulva. Vulva is negative for condylomata and lesion.  Normal vagina.  Vagina is negative for condylomata.  Pelvis: of concern for delivery.   Cervix: Cervix evaluated  by digital exam.     Fetal Exam Fetal Monitor Review: Baseline rate: 147.    Physical Exam Vitals and nursing note reviewed. Exam conducted with a chaperone present.  Constitutional:      Appearance: Normal appearance. She is normal weight.  Pulmonary:     Effort: Pulmonary effort is normal.  Abdominal:     Palpations: Abdomen is soft.  Genitourinary:    General: Normal vulva.  Vulva is no lesion.  Musculoskeletal:        General: Normal range of motion.     Cervical back: Normal range of motion.  Skin:    General: Skin is warm  and dry.     Capillary Refill: Capillary refill takes 2 to 3 seconds.  Neurological:     General: No focal deficit present.     Mental Status: She is alert and oriented to person, place, and time. Mental status is at baseline.  Psychiatric:        Mood and Affect: Mood normal.        Behavior: Behavior normal.        Thought Content: Thought content normal.     Prenatal labs: ABO, Rh: --/--/O POS (10/26 0998) Antibody: NEG (10/26 0953) Rubella: Immune (04/06 0000) RPR: Nonreactive (04/06 0000)  HBsAg: Negative (04/06 0000)  HIV: Non-reactive (04/06 0000)  GBS:  neg  Assessment/Plan: 34AS [id number] female presenting for repeat c/s x 4 and bilateral tubal ligation at 37 0/7wks due to [place] - Admit - ERAS protocol - Reviewed expectations and confirmed consent - To OR when ready    Venetia Night Graclynn Vanantwerp 06/23/2022, 9:01 AM

## 2022-06-24 ENCOUNTER — Other Ambulatory Visit: Payer: Self-pay

## 2022-06-24 ENCOUNTER — Inpatient Hospital Stay (HOSPITAL_COMMUNITY)
Admission: AD | Admit: 2022-06-24 | Discharge: 2022-06-27 | DRG: 784 | Disposition: A | Discharge status: Home / Self Care | Payer: Medicaid Other | Attending: Obstetrics and Gynecology | Admitting: Obstetrics and Gynecology

## 2022-06-24 ENCOUNTER — Inpatient Hospital Stay (HOSPITAL_COMMUNITY): Payer: Medicaid Other | Admitting: Anesthesiology

## 2022-06-24 ENCOUNTER — Encounter (HOSPITAL_COMMUNITY): Payer: Self-pay | Admitting: Obstetrics and Gynecology

## 2022-06-24 ENCOUNTER — Encounter (HOSPITAL_COMMUNITY)
Admission: AD | Disposition: A | Discharge status: Home / Self Care | Payer: Self-pay | Source: Home / Self Care | Attending: Obstetrics and Gynecology

## 2022-06-24 DIAGNOSIS — O365930 Maternal care for other known or suspected poor fetal growth, third trimester, not applicable or unspecified: Secondary | ICD-10-CM | POA: Diagnosis present

## 2022-06-24 DIAGNOSIS — O365931 Maternal care for other known or suspected poor fetal growth, third trimester, fetus 1: Secondary | ICD-10-CM | POA: Diagnosis not present

## 2022-06-24 DIAGNOSIS — Z87891 Personal history of nicotine dependence: Secondary | ICD-10-CM | POA: Diagnosis not present

## 2022-06-24 DIAGNOSIS — O34211 Maternal care for low transverse scar from previous cesarean delivery: Secondary | ICD-10-CM

## 2022-06-24 DIAGNOSIS — Z302 Encounter for sterilization: Secondary | ICD-10-CM | POA: Diagnosis not present

## 2022-06-24 DIAGNOSIS — Z3A37 37 weeks gestation of pregnancy: Secondary | ICD-10-CM

## 2022-06-24 DIAGNOSIS — D62 Acute posthemorrhagic anemia: Secondary | ICD-10-CM | POA: Diagnosis not present

## 2022-06-24 DIAGNOSIS — D563 Thalassemia minor: Secondary | ICD-10-CM | POA: Diagnosis present

## 2022-06-24 DIAGNOSIS — O34219 Maternal care for unspecified type scar from previous cesarean delivery: Principal | ICD-10-CM | POA: Diagnosis present

## 2022-06-24 DIAGNOSIS — O9081 Anemia of the puerperium: Secondary | ICD-10-CM | POA: Diagnosis not present

## 2022-06-24 DIAGNOSIS — Z23 Encounter for immunization: Secondary | ICD-10-CM | POA: Diagnosis not present

## 2022-06-24 DIAGNOSIS — Z148 Genetic carrier of other disease: Secondary | ICD-10-CM

## 2022-06-24 LAB — CBC WITH DIFFERENTIAL/PLATELET
Abs Immature Granulocytes: 0.03 10*3/uL (ref 0.00–0.07)
Basophils Absolute: 0 10*3/uL (ref 0.0–0.1)
Basophils Relative: 0 %
Eosinophils Absolute: 0 10*3/uL (ref 0.0–0.5)
Eosinophils Relative: 0 %
HCT: 29 % — ABNORMAL LOW (ref 36.0–46.0)
Hemoglobin: 9.7 g/dL — ABNORMAL LOW (ref 12.0–15.0)
Immature Granulocytes: 0 %
Lymphocytes Relative: 12 %
Lymphs Abs: 1.2 10*3/uL (ref 0.7–4.0)
MCH: 22.1 pg — ABNORMAL LOW (ref 26.0–34.0)
MCHC: 33.4 g/dL (ref 30.0–36.0)
MCV: 66.1 fL — ABNORMAL LOW (ref 80.0–100.0)
Monocytes Absolute: 0.3 10*3/uL (ref 0.1–1.0)
Monocytes Relative: 3 %
Neutro Abs: 8.7 10*3/uL — ABNORMAL HIGH (ref 1.7–7.7)
Neutrophils Relative %: 85 %
Platelets: 177 10*3/uL (ref 150–400)
RBC: 4.39 MIL/uL (ref 3.87–5.11)
RDW: 15.2 % (ref 11.5–15.5)
WBC: 10.3 10*3/uL (ref 4.0–10.5)
nRBC: 0 % (ref 0.0–0.2)

## 2022-06-24 LAB — RPR: RPR Ser Ql: NONREACTIVE

## 2022-06-24 SURGERY — Surgical Case
Anesthesia: Spinal

## 2022-06-24 MED ORDER — DIPHENHYDRAMINE HCL 50 MG/ML IJ SOLN
INTRAMUSCULAR | Status: AC
  Filled 2022-06-24: qty 1

## 2022-06-24 MED ORDER — INFLUENZA VAC SPLIT QUAD 0.5 ML IM SUSY
0.5000 mL | PREFILLED_SYRINGE | INTRAMUSCULAR | Status: AC
  Administered 2022-06-27: 0.5 mL via INTRAMUSCULAR
  Filled 2022-06-24 (×2): qty 0.5

## 2022-06-24 MED ORDER — ACETAMINOPHEN 500 MG PO TABS: 1000.0000 mg | ORAL_TABLET | Freq: Four times a day (QID) | ORAL | Status: DC

## 2022-06-24 MED ORDER — NALBUPHINE HCL 10 MG/ML IJ SOLN
5.0000 mg | INTRAMUSCULAR | Status: DC | PRN
  Administered 2022-06-24 – 2022-06-25 (×2): 5 mg via INTRAVENOUS
  Filled 2022-06-24 (×3): qty 1

## 2022-06-24 MED ORDER — ACETAMINOPHEN 10 MG/ML IV SOLN
INTRAVENOUS | Status: DC | PRN
  Administered 2022-06-24: 1000 mg via INTRAVENOUS

## 2022-06-24 MED ORDER — KETOROLAC TROMETHAMINE 30 MG/ML IJ SOLN: 30.0000 mg | Freq: Four times a day (QID) | INTRAMUSCULAR | Status: AC | PRN

## 2022-06-24 MED ORDER — SCOPOLAMINE 1 MG/3DAYS TD PT72
1.0000 | MEDICATED_PATCH | Freq: Once | TRANSDERMAL | Status: AC
  Administered 2022-06-24: 1.5 mg via TRANSDERMAL

## 2022-06-24 MED ORDER — BUPIVACAINE IN DEXTROSE 0.75-8.25 % IT SOLN
INTRATHECAL | Status: DC | PRN
  Administered 2022-06-24: 1.6 mL via INTRATHECAL

## 2022-06-24 MED ORDER — SIMETHICONE 80 MG PO CHEW
80.0000 mg | CHEWABLE_TABLET | Freq: Three times a day (TID) | ORAL | Status: DC
  Administered 2022-06-25 – 2022-06-27 (×7): 80 mg via ORAL
  Filled 2022-06-24 (×8): qty 1

## 2022-06-24 MED ORDER — METRONIDAZOLE 500 MG/100ML IV SOLN
500.0000 mg | INTRAVENOUS | Status: AC
  Administered 2022-06-24: 500 mg via INTRAVENOUS
  Filled 2022-06-24: qty 100

## 2022-06-24 MED ORDER — OXYTOCIN-SODIUM CHLORIDE 30-0.9 UT/500ML-% IV SOLN
2.5000 [IU]/h | INTRAVENOUS | Status: AC
  Administered 2022-06-24: 2.5 [IU]/h via INTRAVENOUS
  Filled 2022-06-24 (×2): qty 500

## 2022-06-24 MED ORDER — TETANUS-DIPHTH-ACELL PERTUSSIS 5-2.5-18.5 LF-MCG/0.5 IM SUSY: 0.5000 mL | PREFILLED_SYRINGE | Freq: Once | INTRAMUSCULAR | Status: DC

## 2022-06-24 MED ORDER — SOD CITRATE-CITRIC ACID 500-334 MG/5ML PO SOLN: 30.0000 mL | ORAL | Status: DC

## 2022-06-24 MED ORDER — TRANEXAMIC ACID-NACL 1000-0.7 MG/100ML-% IV SOLN
INTRAVENOUS | Status: DC | PRN
  Administered 2022-06-24: 1000 mg via INTRAVENOUS

## 2022-06-24 MED ORDER — SIMETHICONE 80 MG PO CHEW: 80.0000 mg | CHEWABLE_TABLET | ORAL | Status: DC | PRN

## 2022-06-24 MED ORDER — PHENYLEPHRINE HCL-NACL 20-0.9 MG/250ML-% IV SOLN
INTRAVENOUS | Status: DC | PRN
  Administered 2022-06-24: 60 ug/min via INTRAVENOUS

## 2022-06-24 MED ORDER — MENTHOL 3 MG MT LOZG: 1.0000 | LOZENGE | OROMUCOSAL | Status: DC | PRN

## 2022-06-24 MED ORDER — DIPHENHYDRAMINE HCL 25 MG PO CAPS
25.0000 mg | ORAL_CAPSULE | Freq: Four times a day (QID) | ORAL | Status: DC | PRN
  Filled 2022-06-24: qty 1

## 2022-06-24 MED ORDER — TRANEXAMIC ACID-NACL 1000-0.7 MG/100ML-% IV SOLN
INTRAVENOUS | Status: AC
  Filled 2022-06-24: qty 100

## 2022-06-24 MED ORDER — IBUPROFEN 600 MG PO TABS
600.0000 mg | ORAL_TABLET | Freq: Four times a day (QID) | ORAL | Status: DC
  Administered 2022-06-25 – 2022-06-27 (×7): 600 mg via ORAL
  Filled 2022-06-24 (×8): qty 1

## 2022-06-24 MED ORDER — LIDOCAINE-EPINEPHRINE (PF) 2 %-1:200000 IJ SOLN
INTRAMUSCULAR | Status: DC | PRN
  Administered 2022-06-24 (×2): 5 mL via EPIDURAL

## 2022-06-24 MED ORDER — SENNOSIDES-DOCUSATE SODIUM 8.6-50 MG PO TABS
2.0000 | ORAL_TABLET | Freq: Every day | ORAL | Status: DC
  Administered 2022-06-25 – 2022-06-27 (×3): 2 via ORAL
  Filled 2022-06-24 (×3): qty 2

## 2022-06-24 MED ORDER — GENTAMICIN SULFATE 40 MG/ML IJ SOLN
5.0000 mg/kg | INTRAVENOUS | Status: AC
  Administered 2022-06-24: 380 mg via INTRAVENOUS
  Filled 2022-06-24: qty 9.5

## 2022-06-24 MED ORDER — ONDANSETRON HCL 4 MG/2ML IJ SOLN
INTRAMUSCULAR | Status: AC
  Filled 2022-06-24: qty 2

## 2022-06-24 MED ORDER — EPHEDRINE 5 MG/ML INJ
INTRAVENOUS | Status: AC
  Filled 2022-06-24: qty 5

## 2022-06-24 MED ORDER — GABAPENTIN 100 MG PO CAPS
100.0000 mg | ORAL_CAPSULE | Freq: Two times a day (BID) | ORAL | Status: DC
  Administered 2022-06-24 – 2022-06-27 (×6): 100 mg via ORAL
  Filled 2022-06-24 (×6): qty 1

## 2022-06-24 MED ORDER — FERROUS SULFATE 325 (65 FE) MG PO TABS
325.0000 mg | ORAL_TABLET | Freq: Two times a day (BID) | ORAL | Status: DC
  Administered 2022-06-25 – 2022-06-26 (×3): 325 mg via ORAL
  Filled 2022-06-24 (×3): qty 1

## 2022-06-24 MED ORDER — NALOXONE HCL 0.4 MG/ML IJ SOLN: 0.4000 mg | INTRAMUSCULAR | Status: DC | PRN

## 2022-06-24 MED ORDER — EPHEDRINE SULFATE-NACL 50-0.9 MG/10ML-% IV SOSY
PREFILLED_SYRINGE | INTRAVENOUS | Status: DC | PRN
  Administered 2022-06-24: 5 mg via INTRAVENOUS

## 2022-06-24 MED ORDER — LACTATED RINGERS IV SOLN: INTRAVENOUS | Status: DC

## 2022-06-24 MED ORDER — MORPHINE SULFATE (PF) 0.5 MG/ML IJ SOLN
INTRAMUSCULAR | Status: DC | PRN
  Administered 2022-06-24: 150 ug via INTRATHECAL

## 2022-06-24 MED ORDER — DEXMEDETOMIDINE HCL IN NACL 80 MCG/20ML IV SOLN
INTRAVENOUS | Status: DC | PRN
  Administered 2022-06-24: 2 mL via INTRAVENOUS
  Administered 2022-06-24: 1 mL via INTRAVENOUS
  Administered 2022-06-24: 2 mL via INTRAVENOUS

## 2022-06-24 MED ORDER — OXYCODONE HCL 5 MG PO TABS
5.0000 mg | ORAL_TABLET | ORAL | Status: DC | PRN
  Administered 2022-06-25: 10 mg via ORAL
  Administered 2022-06-25: 5 mg via ORAL
  Administered 2022-06-26 – 2022-06-27 (×3): 10 mg via ORAL
  Filled 2022-06-24 (×3): qty 2
  Filled 2022-06-24: qty 1
  Filled 2022-06-24 (×2): qty 2

## 2022-06-24 MED ORDER — COCONUT OIL OIL
1.0000 | TOPICAL_OIL | Status: DC | PRN
  Administered 2022-06-25: 1 via TOPICAL

## 2022-06-24 MED ORDER — FENTANYL CITRATE (PF) 100 MCG/2ML IJ SOLN: 25.0000 ug | INTRAMUSCULAR | Status: DC | PRN

## 2022-06-24 MED ORDER — FENTANYL CITRATE (PF) 100 MCG/2ML IJ SOLN
INTRAMUSCULAR | Status: DC | PRN
  Administered 2022-06-24: 85 ug via INTRAVENOUS

## 2022-06-24 MED ORDER — DEXMEDETOMIDINE HCL IN NACL 80 MCG/20ML IV SOLN: INTRAVENOUS | Status: DC | PRN

## 2022-06-24 MED ORDER — OXYTOCIN-SODIUM CHLORIDE 30-0.9 UT/500ML-% IV SOLN
INTRAVENOUS | Status: DC | PRN
  Administered 2022-06-24: 200 mL via INTRAVENOUS

## 2022-06-24 MED ORDER — DEXAMETHASONE SODIUM PHOSPHATE 4 MG/ML IJ SOLN
INTRAMUSCULAR | Status: DC | PRN
  Administered 2022-06-24: 4 mg via INTRAVENOUS

## 2022-06-24 MED ORDER — DIPHENHYDRAMINE HCL 50 MG/ML IJ SOLN
12.5000 mg | INTRAMUSCULAR | Status: DC | PRN
  Administered 2022-06-24: 12.5 mg via INTRAVENOUS

## 2022-06-24 MED ORDER — MEPERIDINE HCL 25 MG/ML IJ SOLN: 6.2500 mg | INTRAMUSCULAR | Status: DC | PRN

## 2022-06-24 MED ORDER — KETOROLAC TROMETHAMINE 30 MG/ML IJ SOLN
30.0000 mg | Freq: Four times a day (QID) | INTRAMUSCULAR | Status: AC | PRN
  Administered 2022-06-24: 30 mg via INTRAVENOUS

## 2022-06-24 MED ORDER — POVIDONE-IODINE 10 % EX SWAB: 2.0000 | Freq: Once | CUTANEOUS | Status: DC

## 2022-06-24 MED ORDER — FENTANYL CITRATE (PF) 100 MCG/2ML IJ SOLN
INTRAMUSCULAR | Status: AC
  Filled 2022-06-24: qty 2

## 2022-06-24 MED ORDER — DIBUCAINE (PERIANAL) 1 % EX OINT: 1.0000 | TOPICAL_OINTMENT | CUTANEOUS | Status: DC | PRN

## 2022-06-24 MED ORDER — DIPHENHYDRAMINE HCL 25 MG PO CAPS
25.0000 mg | ORAL_CAPSULE | ORAL | Status: DC | PRN
  Administered 2022-06-25 (×2): 25 mg via ORAL
  Filled 2022-06-24: qty 1

## 2022-06-24 MED ORDER — ZOLPIDEM TARTRATE 5 MG PO TABS: 5.0000 mg | ORAL_TABLET | Freq: Every evening | ORAL | Status: DC | PRN

## 2022-06-24 MED ORDER — ONDANSETRON HCL 4 MG/2ML IJ SOLN: 4.0000 mg | Freq: Three times a day (TID) | INTRAMUSCULAR | Status: DC | PRN

## 2022-06-24 MED ORDER — SODIUM CHLORIDE 0.9% FLUSH: 3.0000 mL | INTRAVENOUS | Status: DC | PRN

## 2022-06-24 MED ORDER — DEXAMETHASONE SODIUM PHOSPHATE 4 MG/ML IJ SOLN
INTRAMUSCULAR | Status: AC
  Filled 2022-06-24: qty 1

## 2022-06-24 MED ORDER — NALOXONE HCL 4 MG/10ML IJ SOLN: 1.0000 ug/kg/h | INTRAVENOUS | Status: DC | PRN

## 2022-06-24 MED ORDER — OXYTOCIN-SODIUM CHLORIDE 30-0.9 UT/500ML-% IV SOLN
INTRAVENOUS | Status: AC
  Filled 2022-06-24: qty 500

## 2022-06-24 MED ORDER — MORPHINE SULFATE (PF) 0.5 MG/ML IJ SOLN
INTRAMUSCULAR | Status: AC
  Filled 2022-06-24: qty 10

## 2022-06-24 MED ORDER — ONDANSETRON HCL 4 MG/2ML IJ SOLN
INTRAMUSCULAR | Status: DC | PRN
  Administered 2022-06-24: 4 mg via INTRAVENOUS

## 2022-06-24 MED ORDER — SCOPOLAMINE 1 MG/3DAYS TD PT72
MEDICATED_PATCH | TRANSDERMAL | Status: AC
  Filled 2022-06-24: qty 1

## 2022-06-24 MED ORDER — ACETAMINOPHEN 500 MG PO TABS
1000.0000 mg | ORAL_TABLET | Freq: Four times a day (QID) | ORAL | Status: DC
  Administered 2022-06-24 – 2022-06-27 (×10): 1000 mg via ORAL
  Filled 2022-06-24 (×11): qty 2

## 2022-06-24 MED ORDER — PRENATAL MULTIVITAMIN CH
1.0000 | ORAL_TABLET | Freq: Every day | ORAL | Status: DC
  Administered 2022-06-25 – 2022-06-27 (×3): 1 via ORAL
  Filled 2022-06-24 (×3): qty 1

## 2022-06-24 MED ORDER — KETOROLAC TROMETHAMINE 30 MG/ML IJ SOLN
INTRAMUSCULAR | Status: AC
  Filled 2022-06-24: qty 1

## 2022-06-24 MED ORDER — ACETAMINOPHEN 500 MG PO TABS: 1000.0000 mg | ORAL_TABLET | ORAL | Status: DC

## 2022-06-24 MED ORDER — FENTANYL CITRATE (PF) 100 MCG/2ML IJ SOLN
INTRAMUSCULAR | Status: DC | PRN
  Administered 2022-06-24: 15 ug via INTRATHECAL

## 2022-06-24 MED ORDER — WITCH HAZEL-GLYCERIN EX PADS: 1.0000 | MEDICATED_PAD | CUTANEOUS | Status: DC | PRN

## 2022-06-24 SURGICAL SUPPLY — 41 items
BENZOIN TINCTURE PRP APPL 2/3 (GAUZE/BANDAGES/DRESSINGS) IMPLANT
CHLORAPREP W/TINT 26ML (MISCELLANEOUS) ×2 IMPLANT
CLAMP UMBILICAL CORD (MISCELLANEOUS) ×1 IMPLANT
CLOTH BEACON ORANGE TIMEOUT ST (SAFETY) ×1 IMPLANT
CLSR STERI-STRIP ANTIMIC 1/2X4 (GAUZE/BANDAGES/DRESSINGS) IMPLANT
DRAPE C SECTION CLR SCREEN (DRAPES) ×1 IMPLANT
DRSG OPSITE POSTOP 4X10 (GAUZE/BANDAGES/DRESSINGS) ×1 IMPLANT
ELECT REM PT RETURN 9FT ADLT (ELECTROSURGICAL) ×1
ELECTRODE REM PT RTRN 9FT ADLT (ELECTROSURGICAL) ×1 IMPLANT
EXTRACTOR VACUUM KIWI (MISCELLANEOUS) IMPLANT
GAUZE SPONGE 4X4 12PLY STRL LF (GAUZE/BANDAGES/DRESSINGS) IMPLANT
GLOVE BIO SURGEON STRL SZ 6.5 (GLOVE) ×1 IMPLANT
GLOVE BIOGEL PI IND STRL 7.0 (GLOVE) ×2 IMPLANT
GOWN STRL REUS W/TWL LRG LVL3 (GOWN DISPOSABLE) ×2 IMPLANT
HEMOSTAT ARISTA ABSORB 3G PWDR (HEMOSTASIS) IMPLANT
KIT ABG SYR 3ML LUER SLIP (SYRINGE) IMPLANT
NDL HYPO 25X5/8 SAFETYGLIDE (NEEDLE) IMPLANT
NEEDLE HYPO 25X5/8 SAFETYGLIDE (NEEDLE) IMPLANT
NS IRRIG 1000ML POUR BTL (IV SOLUTION) ×1 IMPLANT
PACK C SECTION WH (CUSTOM PROCEDURE TRAY) ×1 IMPLANT
PAD ABD 7.5X8 STRL (GAUZE/BANDAGES/DRESSINGS) IMPLANT
PAD OB MATERNITY 4.3X12.25 (PERSONAL CARE ITEMS) ×1 IMPLANT
RETAINER VISCERAL (MISCELLANEOUS) IMPLANT
RETRACTOR WND ALEXIS 25 LRG (MISCELLANEOUS) ×1 IMPLANT
RTRCTR C-SECT PINK 25CM LRG (MISCELLANEOUS) IMPLANT
RTRCTR WOUND ALEXIS 25CM LRG (MISCELLANEOUS) ×1
STRIP CLOSURE SKIN 1/2X4 (GAUZE/BANDAGES/DRESSINGS) IMPLANT
SUT CHROMIC 1 CTX 36 (SUTURE) ×2 IMPLANT
SUT PLAIN 0 NONE (SUTURE) IMPLANT
SUT PLAIN 2 0 XLH (SUTURE) ×1 IMPLANT
SUT VIC AB 0 CT1 27 (SUTURE) ×2
SUT VIC AB 0 CT1 27XBRD ANBCTR (SUTURE) ×2 IMPLANT
SUT VIC AB 0 CT1 36 (SUTURE) IMPLANT
SUT VIC AB 2-0 CT1 27 (SUTURE) ×1
SUT VIC AB 2-0 CT1 TAPERPNT 27 (SUTURE) ×1 IMPLANT
SUT VIC AB 3-0 CT1 27 (SUTURE)
SUT VIC AB 3-0 CT1 TAPERPNT 27 (SUTURE) IMPLANT
SUT VIC AB 4-0 KS 27 (SUTURE) ×1 IMPLANT
TOWEL OR 17X24 6PK STRL BLUE (TOWEL DISPOSABLE) ×1 IMPLANT
TRAY FOLEY W/BAG SLVR 14FR LF (SET/KITS/TRAYS/PACK) ×1 IMPLANT
WATER STERILE IRR 1000ML POUR (IV SOLUTION) ×1 IMPLANT

## 2022-06-24 NOTE — Transfer of Care (Signed)
Immediate Anesthesia Transfer of Care Note  Patient: [facility]  Procedure(s) Performed: CESAREAN SECTION WITH BILATERAL TUBAL LIGATION  Patient Location: PACU  Anesthesia Type:Spinal and Epidural  Level of Consciousness: awake, alert  and oriented  Airway & Oxygen Therapy: Patient Spontanous Breathing  Post-op Assessment: Report given to RN and Post -op Vital signs reviewed and stable  Post vital signs: Reviewed and stable  Last Vitals:  Vitals Value Taken Time  BP 117/72 06/24/22 1751  Temp    Pulse 79 06/24/22 1754  Resp 16 06/24/22 1754  SpO2 97 % 06/24/22 1754  Vitals shown include unvalidated device data.  Last Pain:  Vitals:   06/24/22 1501  TempSrc: Oral  PainSc:          Complications: No notable events documented.

## 2022-06-24 NOTE — Lactation Note (Signed)
This note was copied from a baby's chart. Lactation Consultation Note  Patient Name: Sarah Copeland JOACZ'Y Date: 06/24/2022 Reason for consult: Initial assessment;Early term 37-38.6wks (C/S delivery, ETI infant, Birth Parent has mostly been formula but would like latch assistance in future.) Age:25 hours Current feeding choice is Breast and supplementing with formula. Birth Parent has only been formula feeding currently due to being tired LC did not assist with latch,infant was pace feed 15 mls of 22 kcal ( Similac Neosure with iron) at 2202 pm. Per Birth Parent, she would like to latch infant at the breast and will ask for latch assistance with next feeding. Birth Parent is tired and would like to be set up with DEBP in morning. Birth Parent knows to BF infant according to hunger cues 8+ times within 24 hours, STS and ask for RN/LC for latch assistance if needed. Due to infant being ETI and less than 6 lbs, LC discussed with Birth Parent if infant latch to continue to supplement infant with EBM / Formula after latching infant at the breast for each feeding.  [facility] will follow up in morning with Birth Parent, Birth Parent is very tired. Mom made aware of O/P services, breastfeeding support groups, community resources, and our phone # for post-discharge questions.    Maternal Data Does the patient have breastfeeding experience prior to this delivery?: Yes How long did the patient breastfeed?: Per Birth Parent 3rd child did not latch, she pumped for 3 months.  Feeding Mother's Current Feeding Choice: Breast Milk and Formula  LATCH Score                    Lactation Tools Discussed/Used    Interventions Interventions: Breast feeding basics reviewed;Skin to skin;Education;LC Services brochure  Discharge    Consult Status Consult Status: Follow-up Date: 06/25/22 Follow-up type: In-patient    Sarah Copeland 06/24/2022, 11:15 PM

## 2022-06-24 NOTE — Anesthesia Procedure Notes (Signed)
Epidural Patient location during procedure: OR Start time: 06/24/2022 3:42 PM End time: 06/24/2022 3:49 PM  Staffing Anesthesiologist: Suzette Battiest, MD Performed: anesthesiologist   Preanesthetic Checklist Completed: patient identified, IV checked, site marked, risks and benefits discussed, surgical consent, monitors and equipment checked, pre-op evaluation and timeout performed  Epidural Patient position: sitting Prep: DuraPrep and site prepped and draped Patient monitoring: continuous pulse ox, blood pressure and heart rate Approach: midline Location: L4-L5 Injection technique: LOR air  Needle:  Needle type: Tuohy  Needle gauge: 17 G Needle length: 9 cm and 9 Needle insertion depth: 6 cm Catheter type: closed end flexible Catheter size: 19 Gauge Catheter at skin depth: 11 cm Test dose: negative  Additional Notes Combined spinal/ epidural for csection x4 and BTL. LOR to air at 6cm. Easy pass of spinal needle through touhy with return of CSF. Aspiration before and after injection LA. Needle removed and catheter threaded easily through touhy. Pt tolerated well with no immediate complications. Negative allis test before incision. Deatra Canter, MD

## 2022-06-24 NOTE — Op Note (Addendum)
Operative Note    Preoperative Diagnosis IUP at 28 0/7wks  Fetal Growth Restriction  Previous cesarean section x 3 Complete family / requests sterilization    Postoperative Diagnosis: Same    Procedure: Repeat low transverse cesarean section with bilateral tubal ligation      Surgeon: Mickle Mallory DO  Assist: Edilia Bo MD    An experienced assistant was required given the standard of surgical care given the complexity of the case and maternal body habitus.  This assistant was needed for exposure, dissection, suctioning, retraction, instrument exchange, assisting with delivery with administration of fundal pressure, and for overall help during the procedure.    Anesthesia: Combined Spinal and Epidural   Fluids: LR 740ml EBL: 565ml UOP: 552ml   Findings: Viable female infant in vertex position, APGARS 8,9; Weight pending Grossly normal uterus with adhesion of uterus to omentum anteriorly, Grossly normal bilateral fallopian tubes and ovaries.    Specimen: Portions of both fallopian tubes, placenta to L/D   Procedure Note Patient was taken to the operating room where combined spinal/epidural anesthesia was administered and noted to be adequate. She was prepped and draped in the normal sterile fashion in the dorsal supine position with a leftward tilt. An appropriate time out was performed. An allis clamp test was performed and anesthesia was found to be adequate.  Due to a starting Hg of 9.6, 1gm of tranexamic acid was administered by anesthesia at start of procedure. A Pfannenstiel skin incision was then made through the previous incision with the scalpel and carried through to the underlying layer of fascia by sharp dissection. The fascia was nicked in the midline and the incision was extended laterally with Mayo scissors. The superior aspect of the incision was grasped kocher clamps and dissected off the underlying rectus muscles.. Separation of the rectus muscles in the midline  was noted thus it was extended both superiorly and inferiorly with careful attention to avoid both bowel and bladder. An adhesion of omentum to the anterior uterus was reduced with hemostasis noted.  The Alexis self-retaining wound retractor was then placed and the lower uterine segment exposed. The lower uterine segment was then incised in a transverse fashion and the cavity entered bluntly. Clear amniotic fluid was noted.  . The infant's head was then lifted and delivered from the incision; loose nuchal was reduced over infants head during delivery. Anterior and posterior shoulders delivered easily next; body quickly followed. Bulb suction of mouth and nose were performed. Vigorous cry elicited.  After a minute delay, the cord was clamped and cut and infant handed off to the waiting NICU team. Cord blood was obtained and the placenta was then spontaneously expressed from the uterus, duncan, and the uterus cleared of all clots and debris with moist lap sponge. The uterine incision was then repaired in a single layer with a  running locked layer 0 chromic suture.  Next, the left and then right tubes were grasped with a babcock in the mid isthmus region and  a 2-3 cm intervening area suture ligated with the segment in the middle cut. Excellent hemostasis was appreciated. The  ovaries were inspected and found to be grossly normal. The gutters were  cleared of all clots and debris. The uterine incision was inspected once again and found to be hemostatic.  All instruments and sponges as well as the Alexis retractor were then removed from the abdomen.  The fascia was then closed with 0 Vicryl in a running fashion. An area of bleeding  from the right uterine incision was noted during closure and thus closure of the fascia was paused and a suture ligation of the right uterine incision performed.  Arista was then applied. The fascial closure was then completer.  Next the skin was closed with a subcuticular stitch of  4-0 Vicryl on a Keith needle and then reinforced with benzoin and Steri-Strips and a pressure dressing.  At the conclusion of the procedure all instruments and sponge and needed counts were noted to be correct. Patient was taken to the recovery room in stable condition with her baby accompanying her skin to skin.

## 2022-06-24 NOTE — Anesthesia Preprocedure Evaluation (Signed)
Anesthesia Evaluation  Patient identified by MRN, date of birth, ID band Patient awake    Reviewed: Allergy & Precautions, NPO status , Patient's Chart, lab work & pertinent test results  Airway Mallampati: II  TM Distance: >3 FB     Dental   Pulmonary former smoker,    breath sounds clear to auscultation       Cardiovascular negative cardio ROS   Rhythm:Regular Rate:Normal     Neuro/Psych negative neurological ROS     GI/Hepatic negative GI ROS, Neg liver ROS,   Endo/Other  negative endocrine ROS  Renal/GU negative Renal ROS     Musculoskeletal   Abdominal   Peds  Hematology  (+) Blood dyscrasia, anemia ,   Anesthesia Other Findings   Reproductive/Obstetrics (+) Pregnancy (repeat c-sec x4)                             Anesthesia Physical Anesthesia Plan  ASA: 2  Anesthesia Plan: Combined Spinal and Epidural   Post-op Pain Management:    Induction:   PONV Risk Score and Plan: 2 and Dexamethasone and Ondansetron  Airway Management Planned: Natural Airway  Additional Equipment:   Intra-op Plan:   Post-operative Plan:   Informed Consent: I have reviewed the patients History and Physical, chart, labs and discussed the procedure including the risks, benefits and alternatives for the proposed anesthesia with the patient or authorized representative who has indicated his/her understanding and acceptance.       Plan Discussed with:   Anesthesia Plan Comments:         Anesthesia Quick Evaluation

## 2022-06-25 LAB — CBC
HCT: 26.6 % — ABNORMAL LOW (ref 36.0–46.0)
Hemoglobin: 8.7 g/dL — ABNORMAL LOW (ref 12.0–15.0)
MCH: 21.4 pg — ABNORMAL LOW (ref 26.0–34.0)
MCHC: 32.7 g/dL (ref 30.0–36.0)
MCV: 65.5 fL — ABNORMAL LOW (ref 80.0–100.0)
Platelets: 181 10*3/uL (ref 150–400)
RBC: 4.06 MIL/uL (ref 3.87–5.11)
RDW: 15.4 % (ref 11.5–15.5)
WBC: 11 10*3/uL — ABNORMAL HIGH (ref 4.0–10.5)
nRBC: 0 % (ref 0.0–0.2)

## 2022-06-25 NOTE — Anesthesia Postprocedure Evaluation (Signed)
Anesthesia Post Note  Patient: [employer]  Procedure(s) Performed: CESAREAN SECTION WITH BILATERAL TUBAL LIGATION     Patient location during evaluation: PACU Anesthesia Type: Spinal and MAC Level of consciousness: awake and alert Pain management: pain level controlled Vital Signs Assessment: post-procedure vital signs reviewed and stable Respiratory status: spontaneous breathing and respiratory function stable Cardiovascular status: blood pressure returned to baseline and stable Postop Assessment: spinal receding and epidural receding Anesthetic complications: no   No notable events documented.  Last Vitals:  Vitals:   06/24/22 2245 06/25/22 0300  BP: 133/82 134/83  Pulse: 86 70  Resp: 16 18  Temp: 36.7 C 36.8 C  SpO2: 99%     Last Pain:  Vitals:   06/25/22 0508  TempSrc:   PainSc: Tyler Deis

## 2022-06-25 NOTE — Clinical Social Work Maternal (Signed)
CLINICAL SOCIAL WORK MATERNAL/CHILD NOTE  Patient Details  Name: Sarah Copeland MRN: [id number] Date of Birth: 05/15/1996  Date:  06/25/2022  Clinical Social Worker Initiating Note:  Dylon Correa, LCSW Date/Time: Initiated:  06/25/22/1115     Child's Name:  Tamari Ann Watson   Biological Parents:  Mother, Father (MOB: Marji Thibault 8 /07/1997 and FOB: Torreace Watson 10-11-1983)   Need for Interpreter:  None   Reason for Referral:  Other (Comment), Behavioral Health Concerns (No custody of older two children)   Address:  [address] [place] [address]    Phone number:  [phone] (home)     Additional phone number:   Household Members/Support Persons (HM/SP):   Household Member/Support Person 1, Household Member/Support Person 2   HM/SP Name Relationship DOB or Age  HM/SP -1 Torreace Watson Significant Other 10-11-1983  HM/SP -2 Kehlani Watson Daughter 07-19-2021  HM/SP -3        HM/SP -4        HM/SP -5        HM/SP -6        HM/SP -7        HM/SP -8          Natural Supports (not living in the home):      Professional Supports: None   Employment: Full-time   Type of Work: Light Industrial Contractor   Education:  High school graduate   Homebound arranged:    Financial Resources:  Medicaid   Other Resources:  Food Stamps  , WIC   Cultural/Religious Considerations Which May Impact Care:    Strengths:  Ability to meet basic needs  , Home prepared for child  , Pediatrician chosen   Psychotropic Medications:         Pediatrician:    [place] area  Pediatrician List:   [place] Other ([facility])  [place]    [place]    [place]    [place]    [place]      Pediatrician Fax Number:    Risk Factors/Current Problems:  DHHS Involvement     Cognitive State:  Able to Concentrate  , Alert  , Goal Oriented  , Insightful     Mood/Affect:  Calm  , Interested  , Happy  ,  Comfortable     CSW Assessment:  CSW received consult for hx of Anxiety, Depression poor family and social support. Per chart review, MOB does not have custody of older two children, CPS involvement. CSW met with MOB to offer support and complete assessment.    CSW met with MOB at bedside and introduced CSW role. CSW observed MOB eating breakfast and the infant asleep in the bassinet at bedside. MOB presented calm and welcomed CSW visit. MOB stated that she has been feeling fine since giving birth and adjusting well to the newborn. MOB reported that she lives with FOB " Torreace" and their daughter "Kehlani."  CSW inquired about MOB other children (Ashante Cole (5) and Darrell Cole Jr. (3)). MOB stated that she did not want to talk about her children. She stated the children are not in her custody and that "things are complicated" and "hard to discuss." CSW informed MOB per hospital protocol if CPS is involved with the removal of children, then another CPS report will file. MOB reported understanding. MOB apologized to CSW for getting upset and not wanting to talk about her children. CSW expressed understanding. MOB did not go into detail about   the allegations but that the case was open in [place], [place]. She reported her children live in [place] with their father who has full custody. She expressed frustration with paying child support and not being able to see her children as often as she would like. MOB reported that she does not believe her children are being properly cared for by their father and she plans to hire a lawyer to get custody of her children. CSW provided active listening and provided emotional support. MOB reported for a while she was very depressed about felt like a bad mother. However, since completing parenting anger management mandated by CPS. She feels she feels she parents well and loves her children. MOB stated she does not have family support; her mom struggles with a drug addiction  and her sister does not have custody of own children. MOB shared her primary support is FOB, he receives disability and cares for the children while she works. 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 CSW acknowledged MOB support and efforts.    CSW provided education regarding the baby blues period vs. perinatal mood disorders, discussed treatment and gave resources for mental health follow up if concerns arise.  CSW recommends self-evaluation during the postpartum time period using the New Mom Checklist from Postpartum Progress and encouraged MOB to contact a medical professional if symptoms are noted at any time. CSW assessed MOB for safety. MOB denied thoughts of harm to self and others.   MOB reported she has essential items for the infant including a bassinet where the infant will sleep. CSW provided review of Sudden Infant Death Syndrome (SIDS) precautions. CSW offered and discussed community support. MOB politely declined services.  MOB does not have custody of older two children, CPS involvement.CSW made a report to [place] CPS intake Pam Miller.    CSW Plan/Description:  Child Protective Service Report  , Perinatal Mood and Anxiety Disorder (PMADs) Education, Sudden Infant Death Syndrome (SIDS) Education, CSW Awaiting CPS Disposition Plan    Cagney Steenson A Jonnae Fonseca, LCSW 06/25/2022, 2:25 PM 

## 2022-06-25 NOTE — Social Work (Signed)
Per DHHS [place] CPS intake Pam Miller, the report was screened out. There are no barriers to infant's discharge.   Jiovanna Frei, MSW, LCSW [facility]  Clinical Social Worker  [phone] 06/25/2022  4:32 PM  

## 2022-06-25 NOTE — Lactation Note (Signed)
This note was copied from a baby's chart. Lactation Consultation Note  Patient Name: Sarah Copeland KYHCW'C Date: 06/25/2022 Reason for consult: Follow-up assessment;Early term 37-38.6wks;Infant < 6lbs Age:25 hours  P2, 37 weeks.  Mother has been formula feeding but her baby has been rooting,cueing at her breast and mother requested assistance with latching. Mother can hand express good flow. Baby latched with ease.  Swallows noted. Encouraged mother to offer breast before formula to help establish her milk supply. Reminded mother to feed q 3hours. Observed latch on both breasts.   Maternal Data Has patient been taught Hand Expression?: Yes  Feeding Mother's Current Feeding Choice: Breast Milk and Formula  LATCH Score Latch: Grasps breast easily, tongue down, lips flanged, rhythmical sucking.  Audible Swallowing: A few with stimulation  Type of Nipple: Everted at rest and after stimulation  Comfort (Breast/Nipple): Soft / non-tender  Hold (Positioning): Assistance needed to correctly position infant at breast and maintain latch.  LATCH Score: 8   Lactation Tools Discussed/Used    Interventions Interventions: Assisted with latch;Skin to skin;Hand express;Education  Discharge    Consult Status Consult Status: Follow-up Date: 06/26/22 Follow-up type: In-patient    Vivianne Master [facility] 06/25/2022, 2:34 PM

## 2022-06-26 ENCOUNTER — Encounter (HOSPITAL_COMMUNITY): Payer: Self-pay | Admitting: Obstetrics and Gynecology

## 2022-06-26 LAB — SURGICAL PATHOLOGY

## 2022-06-26 MED ORDER — POLYSACCHARIDE IRON COMPLEX 150 MG PO CAPS
150.0000 mg | ORAL_CAPSULE | Freq: Every day | ORAL | Status: DC
  Administered 2022-06-26 – 2022-06-27 (×2): 150 mg via ORAL
  Filled 2022-06-26 (×2): qty 1

## 2022-06-26 MED ORDER — POLYSACCHARIDE IRON COMPLEX 150 MG PO CAPS: 150.0000 mg | ORAL_CAPSULE | Freq: Every day | ORAL | Status: DC

## 2022-06-26 NOTE — Progress Notes (Signed)
Subjective: Postpartum Day 1: Cesarean Delivery Doing well. Foley removed this morning and has not yet voided or ambulated. Pain controlled.   Objective: Patient Vitals for the past 24 hrs:  BP Temp Temp src Pulse Resp SpO2  06/26/22 0605 130/79 98.6 F (37 C) Oral 81 18 96 %  06/25/22 1950 (!) 124/58 98.5 F (36.9 C) Oral 82 17 --  06/25/22 1947 -- 98.5 F (36.9 C) Oral 82 17 --  06/25/22 1458 107/81 98.4 F (36.9 C) Oral 66 16 100 %  06/25/22 1121 119/74 97.7 F (36.5 C) Oral 63 16 --  06/25/22 0852 119/83 98 F (36.7 C) Oral 64 16 98 %  06/25/22 0731 (!) 133/94 97.8 F (36.6 C) Oral 72 18 98 %    Physical Exam:  General: alert, cooperative, and no distress Lochia: appropriate Uterine Fundus: firm Incision: covered with clean/dry pressure dressing.  DVT Evaluation: No evidence of DVT seen on physical exam.  Recent Labs    06/24/22 2022 06/25/22 0516  HGB 9.7* 8.7*  HCT 29.0* 26.6*    Assessment/Plan:  [facility] [id number] POD#1 sp repeat CS and tubal ligation at  [redacted]w[redacted]d 1. PPC: routine PP care, follow up void 2. Anemia or pregnancy and acute blood loss anemia: PO iron, asymptomatic 3 Rh pos 4. Dispo: plan discharge home POD2 or [place] 06/25/22 10:05AM

## 2022-06-26 NOTE — Progress Notes (Signed)
POD #2 Doing ok, still some pain Afeb, VSS Abd- soft, fundus firm, incision intact, dressing peeling off Continue routine care, replace honeycomb dressing

## 2022-06-27 ENCOUNTER — Ambulatory Visit: Payer: Self-pay

## 2022-06-27 MED ORDER — IBUPROFEN 600 MG PO TABS: 600.0000 mg | ORAL_TABLET | Freq: Four times a day (QID) | ORAL | 0 refills | Status: AC

## 2022-06-27 MED ORDER — OXYCODONE HCL 5 MG PO TABS: 5.0000 mg | ORAL_TABLET | ORAL | 0 refills | Status: AC | PRN

## 2022-06-27 NOTE — Lactation Note (Signed)
This note was copied from a baby's chart. Lactation Consultation Note  Patient Name: Sarah Copeland NUUVO'Z Date: 06/27/2022 Reason for consult: Follow-up assessment;Early term 37-38.6wks;Infant < 6lbs Age:25 hours Baby had to stay d/t sleeping to much and not eating enough. Mom's milk is coming in and breast are filling. Mom last pumped 2 hrs ago and breast are feeling heavy and full. Encouraged to pump before gets engorged. Attempted to give baby BM in bottle. Baby only took 15 ml. Room very warm. Suggest mom lowered temperature so baby may not be as sleepy. Encouraged to put baby to breast to also help relieve fullness. Mom stated they are hurting to bad right now for that shed rather pump.  Maternal Data Has patient been taught Hand Expression?: Yes  Feeding Nipple Type: Nfant Extra Slow Flow (gold)  LATCH Score          Comfort (Breast/Nipple): Filling, red/small blisters or bruises, mild/mod discomfort (breast filling up w/milk. encouraged to pump)         Lactation Tools Discussed/Used    Interventions    Discharge    Consult Status Consult Status: Follow-up Date: 06/28/22 Follow-up type: In-patient    Theodoro Kalata 06/27/2022, 8:28 PM

## 2022-06-27 NOTE — Progress Notes (Signed)
  Patient is eating, ambulating, voiding.  Pain control is good.  Vitals:   06/26/22 0849 06/26/22 1314 06/26/22 2017 06/27/22 0506  BP: 124/80 125/80 118/76 124/82  Pulse: 64 72 71 65  Resp: 16 17 16 17   Temp: 97.8 F (36.6 C) 98 F (36.7 C) 98.8 F (37.1 C) 98.1 F (36.7 C)  TempSrc: Oral Oral Oral Oral  SpO2: 98% 100%    Weight:      Height:        lungs:   clear to auscultation cor:    RRR Abdomen:  soft, appropriate tenderness, incisions intact and without erythema or exudate ex:    no cords   Lab Results  Component Value Date   WBC 11.0 (H) 06/25/2022   HGB 8.7 (L) 06/25/2022   HCT 26.6 (L) 06/25/2022   MCV 65.5 (L) 06/25/2022   PLT 181 06/25/2022    --/--/O POS (10/02 1047)/RI  A/P    Post operative day 3 repeat C/S and tubal.  Routine post op and postpartum care.  Expect d/c today.  Oxycodone for pain control.  Iron for anemia.

## 2022-06-27 NOTE — Discharge Summary (Signed)
Postpartum Discharge Summary  Date of Service updated      Patient Name: Sarah Copeland DOB: 02-20-1996 MRN: [id number]  Date of admission: 06/24/2022 Delivery date:06/24/2022  Delivering provider: Sherlyn Hay  Date of discharge: 06/27/2022  Admitting diagnosis: Previous cesarean delivery affecting pregnancy [O34.219] Intrauterine pregnancy: [redacted]w[redacted]d     Secondary diagnosis:  Principal Problem:   Previous cesarean delivery affecting pregnancy  Additional problems: none    Discharge diagnosis: Term Pregnancy Delivered                                              Post partum procedures: none Augmentation: N/A Complications: None  Hospital course: Sceduled C/S   26 y.o. yo [id number] at [redacted]w[redacted]d was admitted to the hospital 06/24/2022 for scheduled cesarean section with the following indication:Elective Repeat.Delivery details are as follows:  Membrane Rupture Time/Date: 4:21 PM ,06/24/2022   Delivery Method:C-Section, Low Transverse  Details of operation can be found in separate operative note.  Patient had a postpartum course complicated by none.  She is ambulating, tolerating a regular diet, passing flatus, and urinating well. Patient is discharged home in stable condition on  06/27/22        Newborn Data: Birth date:06/24/2022  Birth time:4:21 PM  Gender:Female  Living status:Living  Apgars:8 ,9  Weight:2420 g     Magnesium Sulfate received: No BMZ received: No Rhophylac:No MMR:No T-DaP:Given prenatally Flu: No Transfusion:No  Physical exam  Vitals:   06/26/22 0849 06/26/22 1314 06/26/22 2017 06/27/22 0506  BP: 124/80 125/80 118/76 124/82  Pulse: 64 72 71 65  Resp: $Remo'16 17 16 17  'ZeCEM$ Temp: 97.8 F (36.6 C) 98 F (36.7 C) 98.8 F (37.1 C) 98.1 F (36.7 C)  TempSrc: Oral Oral Oral Oral  SpO2: 98% 100%    Weight:      Height:        Labs: Lab Results  Component Value Date   WBC 11.0 (H) 06/25/2022   HGB 8.7 (L) 06/25/2022   HCT 26.6 (L) 06/25/2022   MCV  65.5 (L) 06/25/2022   PLT 181 06/25/2022      Latest Ref Rng & Units 03/01/2021    8:00 PM  CMP  Glucose 70 - 99 mg/dL 77   BUN 6 - 20 mg/dL 7   Creatinine 0.44 - 1.00 mg/dL 0.50   Sodium 135 - 145 mmol/L 134   Potassium 3.5 - 5.1 mmol/L 3.3   Chloride 98 - 111 mmol/L 105   CO2 22 - 32 mmol/L 20   Calcium 8.9 - 10.3 mg/dL 9.4   Total Protein 6.5 - 8.1 g/dL 7.3   Total Bilirubin 0.3 - 1.2 mg/dL 0.4   Alkaline Phos 38 - 126 U/L 68   AST 15 - 41 U/L 16   ALT 0 - 44 U/L 13    Edinburgh Score:    09/04/2021    2:34 PM  Edinburgh Postnatal Depression Scale Screening Tool  I have been able to laugh and see the funny side of things. 0  I have looked forward with enjoyment to things. 0  I have blamed myself unnecessarily when things went wrong. 0  I have been anxious or worried for no good reason. 2  I have felt scared or panicky for no good reason. 0  Things have been getting on top of me. 0  I have  been so unhappy that I have had difficulty sleeping. 0  I have felt sad or miserable. 0  I have been so unhappy that I have been crying. 0  The thought of harming myself has occurred to me. 0  Edinburgh Postnatal Depression Scale Total 2      After visit meds:  Allergies as of 06/27/2022       Reactions   Amoxicillin Anaphylaxis        Medication List     STOP taking these medications    terconazole 0.4 % vaginal cream Commonly known as: TERAZOL 7       TAKE these medications    ferrous sulfate 325 (65 FE) MG tablet Take 325 mg by mouth daily.   ibuprofen 600 MG tablet Commonly known as: ADVIL Take 1 tablet (600 mg total) by mouth every 6 (six) hours.   oxyCODONE 5 MG immediate release tablet Commonly known as: Oxy IR/ROXICODONE Take 1-2 tablets (5-10 mg total) by mouth every 4 (four) hours as needed for moderate pain.   prenatal multivitamin Tabs tablet Take 1 tablet by mouth daily.               Discharge Care Instructions  (From admission,  onward)           Start     Ordered   06/27/22 0000  Discharge wound care:       Comments: Sitz baths and icepacks to perineum.  If stitches, they will dissolve.   06/27/22 0656             Discharge home in stable condition Infant Feeding:  ? Infant Disposition:home with mother Discharge instruction: per After Visit Summary and Postpartum booklet. Activity: Advance as tolerated. Pelvic rest for 6 weeks.  Diet: routine diet Anticipated Birth Control: Unsure Postpartum Appointment:2 weeks Additional Postpartum F/U: Incision check 2 weeks Future Appointments:No future appointments. Follow up Visit:  [place], DO Follow up in 2 week(s).   Specialty: Obstetrics and Gynecology Contact information: [address] [place] [address] [phone]                     06/27/2022 Daria Pastures, MD

## 2022-06-29 ENCOUNTER — Ambulatory Visit: Payer: Self-pay

## 2022-06-29 NOTE — Lactation Note (Signed)
This note was copied from a baby's chart. Lactation Consultation Note  Patient Name: Sarah Copeland [id number] Date: 06/29/2022 Reason for consult: Follow-up assessment;Early term 37-38.6wks;Infant < 6lbs;Exclusive pumping and bottle feeding (per Birth parent - milk is well in and pumping off a good amount of milk. LC reviewed supply / demand/ importance of consistent pumping around the clock . LC reviewed BF D/C teaching and [place] resources. Milk in the refrig packed on ice for transport home.) Age:25 days  Maternal Data    Feeding Mother's Current Feeding Choice: Breast Milk Nipple Type: Nfant Extra Slow Flow (gold)     Interventions - education     Discharge Discharge Education: Engorgement and breast care;Warning signs for feeding baby Pump: DEBP;Personal  Consult Status Consult Status: Complete Date: 06/29/22    Sarah Copeland 06/29/2022, 10:07 AM

## 2022-07-03 ENCOUNTER — Telehealth (HOSPITAL_COMMUNITY): Payer: Self-pay | Admitting: *Deleted

## 2022-07-03 NOTE — Telephone Encounter (Signed)
Hospital Discharge Follow-Up Call:  Patient reports that she is well and has no concerns about her healing process.  EPDS today was 0 and she endorses this accurately reflects that she is doing well emotionally.  Patient says that baby is well and she has no concerns about baby's health.  She reports that baby sleeps in a bassinet insert of a playpen.  ABCs of Safe Sleep reviewed.

## 2022-10-29 IMAGING — DX DG FOOT COMPLETE 3+V*R*
3 series · 3 of 3 positions shown · non-contrast
Comparison: None.

CLINICAL DATA: Trauma

EXAM:
RIGHT FOOT COMPLETE - 3+ VIEW

[foot ap]
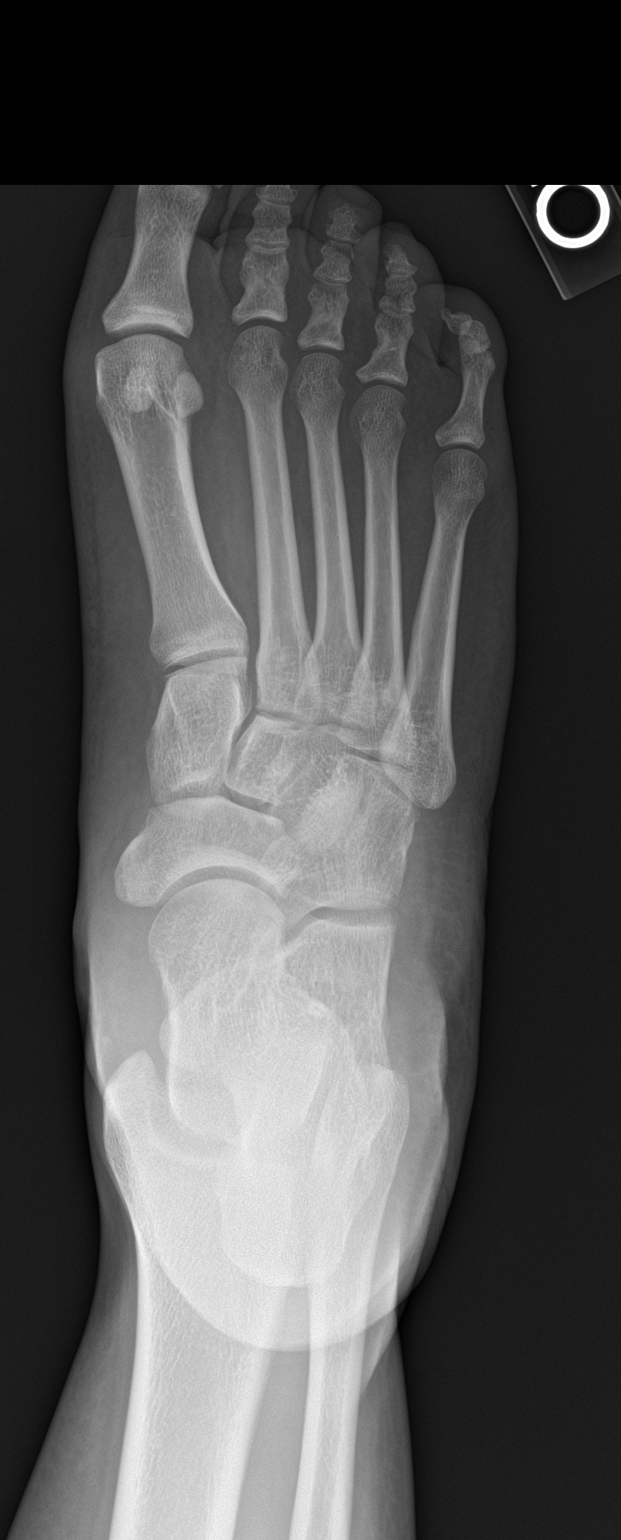

[foot obl]
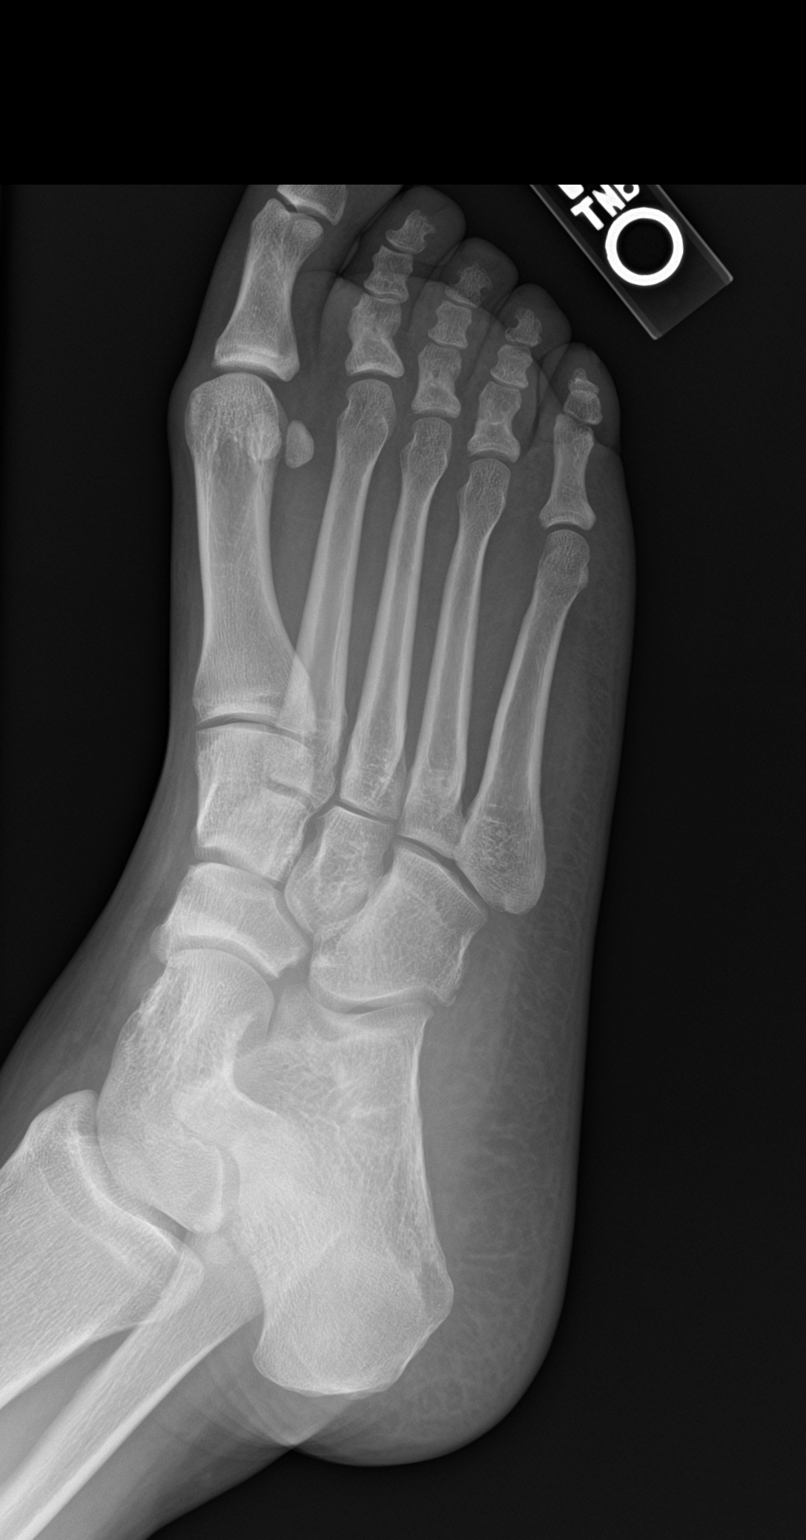

[foot lat]
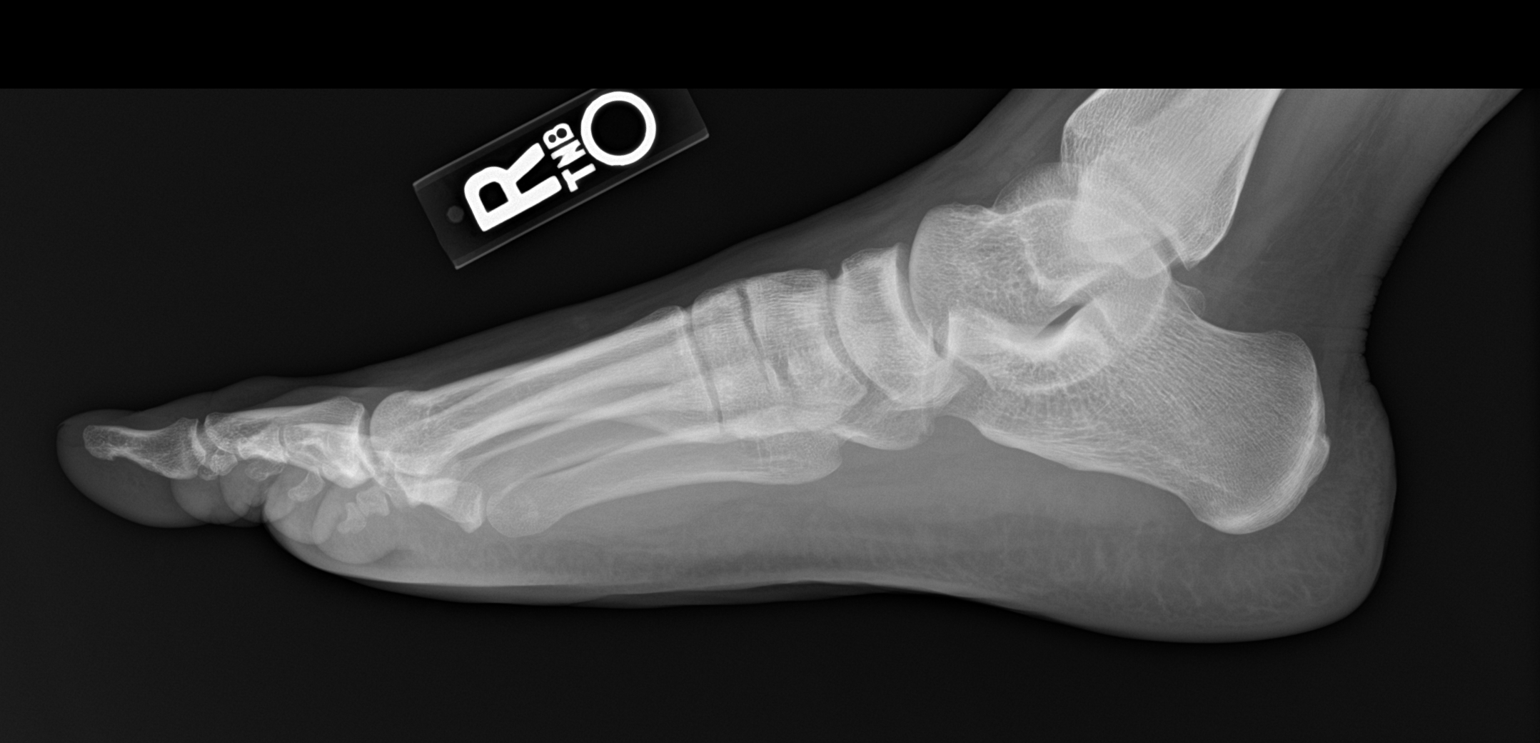

[3 of 3 positions shown; findings below may reference images not displayed]

FINDINGS: There is no evidence of fracture or dislocation. There is no
evidence of arthropathy or other focal bone abnormality. Soft
tissues are unremarkable.
IMPRESSION: Negative.

## 2022-10-29 IMAGING — DX DG ANKLE COMPLETE 3+V*R*
3 series · 3 of 3 positions shown · non-contrast
Comparison: Foot series today

CLINICAL DATA: Foot and ankle pain after fall

EXAM:
RIGHT ANKLE - COMPLETE 3+ VIEW

[ankle ap]
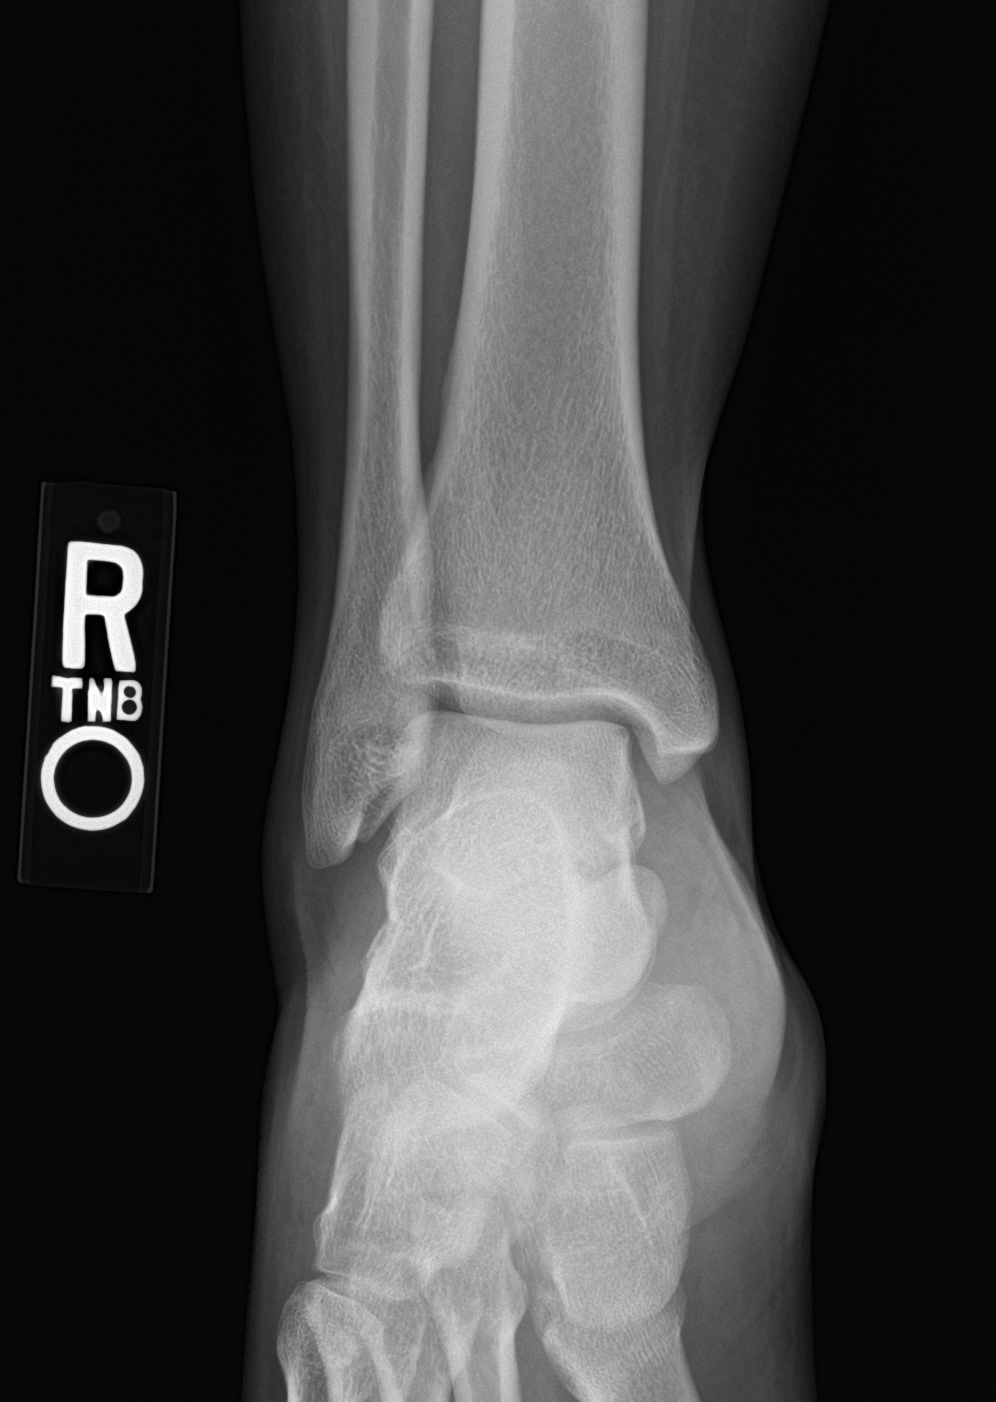

[ankle obl]
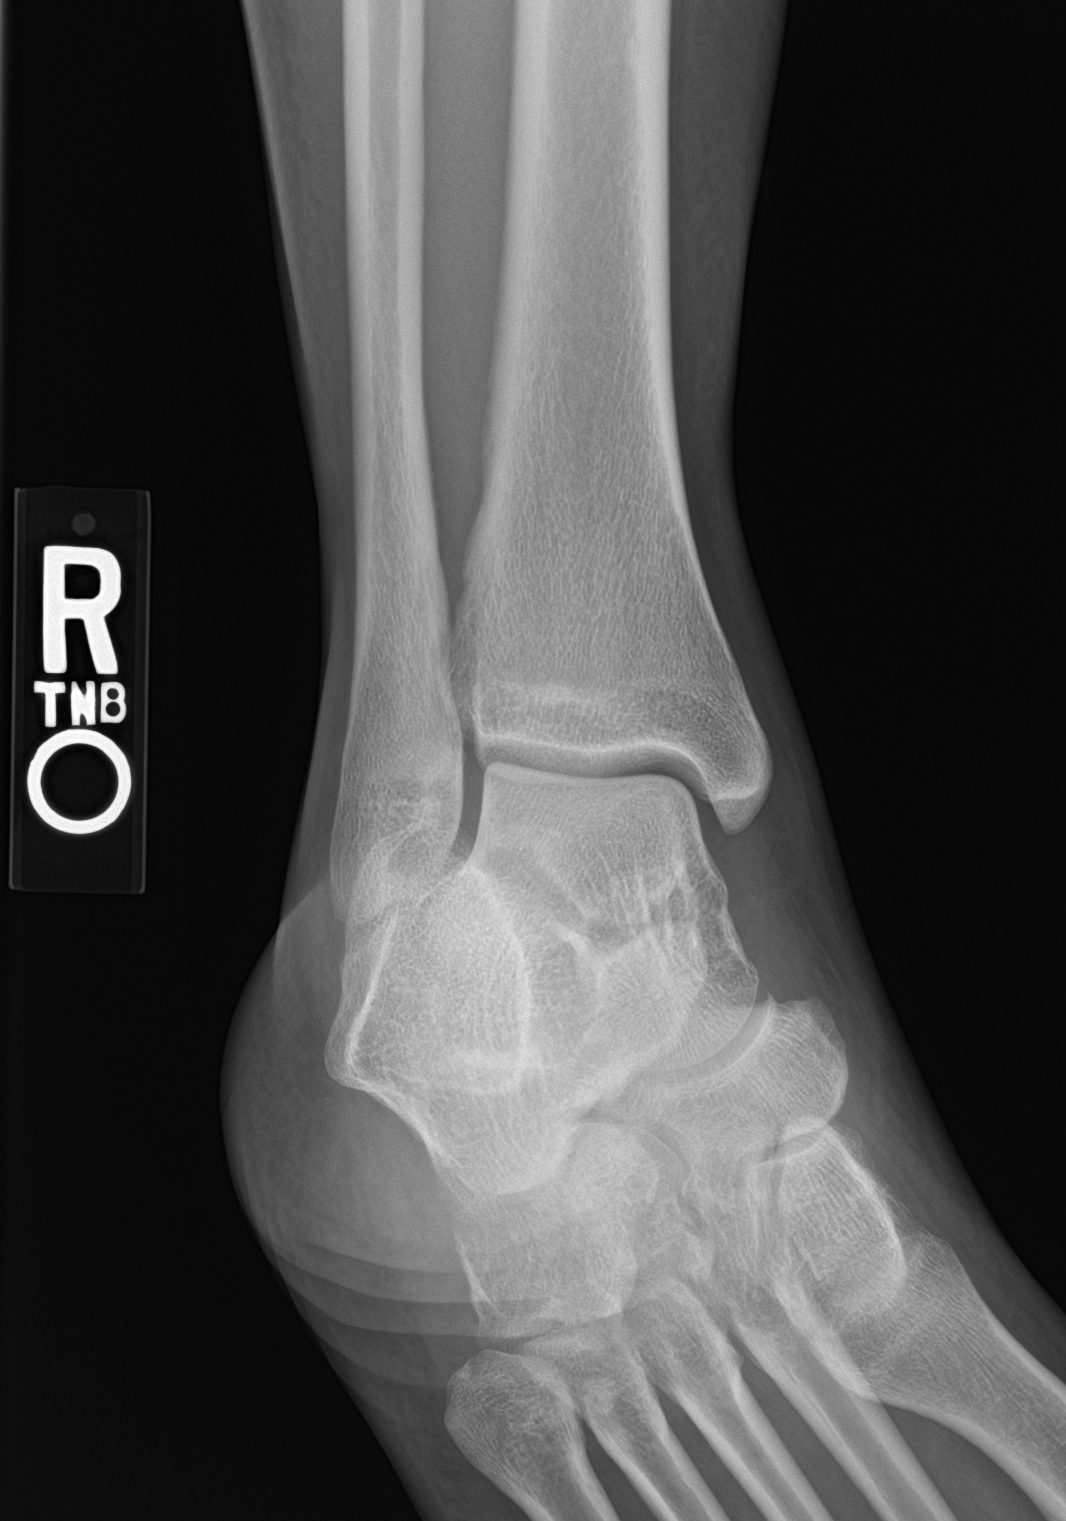

[ankle lat]
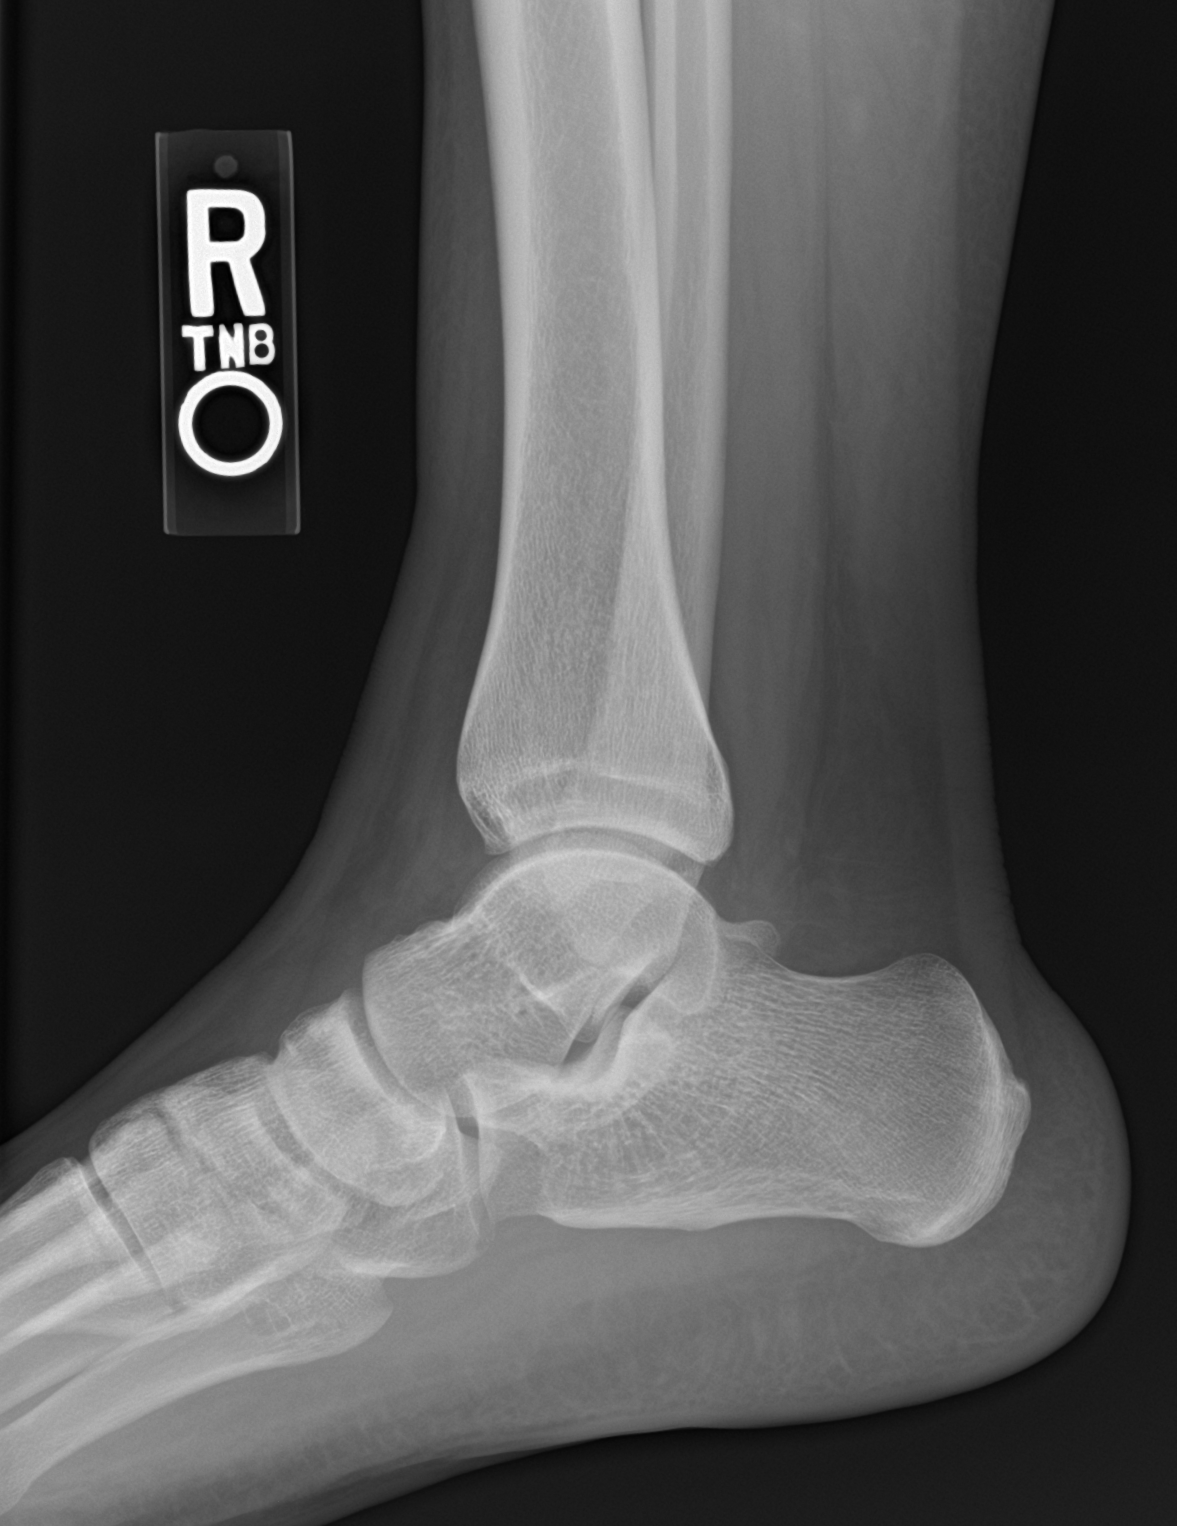

[3 of 3 positions shown; findings below may reference images not displayed]

FINDINGS: There is no evidence of fracture, dislocation, or joint effusion.
There is no evidence of arthropathy or other focal bone abnormality.
Soft tissues are unremarkable.
IMPRESSION: Negative.
